# Patient Record
Sex: Male | Born: 1985 | Race: White | Hispanic: No | Smoking: Former smoker
Health system: Southern US, Community
[De-identification: ages and names within clinical notes are randomized; demographics above are authoritative.]

## PROBLEM LIST (undated history)

## (undated) DIAGNOSIS — F419 Anxiety disorder, unspecified: Secondary | ICD-10-CM

## (undated) DIAGNOSIS — M5430 Sciatica, unspecified side: Secondary | ICD-10-CM

## (undated) DIAGNOSIS — F99 Mental disorder, not otherwise specified: Secondary | ICD-10-CM

## (undated) DIAGNOSIS — T7840XA Allergy, unspecified, initial encounter: Secondary | ICD-10-CM

## (undated) HISTORY — DX: Allergy, unspecified, initial encounter: T78.40XA

## (undated) HISTORY — DX: Anxiety disorder, unspecified: F41.9

---

## 2008-09-14 ENCOUNTER — Ambulatory Visit: Payer: Self-pay | Admitting: Family Medicine

## 2008-09-14 DIAGNOSIS — R519 Headache, unspecified: Secondary | ICD-10-CM | POA: Insufficient documentation

## 2008-09-14 DIAGNOSIS — R51 Headache: Secondary | ICD-10-CM

## 2008-09-14 DIAGNOSIS — K8020 Calculus of gallbladder without cholecystitis without obstruction: Secondary | ICD-10-CM | POA: Insufficient documentation

## 2008-09-14 DIAGNOSIS — F411 Generalized anxiety disorder: Secondary | ICD-10-CM | POA: Insufficient documentation

## 2008-09-14 DIAGNOSIS — J309 Allergic rhinitis, unspecified: Secondary | ICD-10-CM | POA: Insufficient documentation

## 2008-10-01 ENCOUNTER — Ambulatory Visit: Payer: Self-pay | Admitting: Family Medicine

## 2008-10-01 DIAGNOSIS — T148XXA Other injury of unspecified body region, initial encounter: Secondary | ICD-10-CM | POA: Insufficient documentation

## 2008-12-06 ENCOUNTER — Ambulatory Visit: Payer: Self-pay | Admitting: Family Medicine

## 2008-12-06 ENCOUNTER — Telehealth: Payer: Self-pay | Admitting: Family Medicine

## 2008-12-06 DIAGNOSIS — M5430 Sciatica, unspecified side: Secondary | ICD-10-CM

## 2009-04-16 ENCOUNTER — Telehealth: Payer: Self-pay | Admitting: Family Medicine

## 2009-04-18 ENCOUNTER — Ambulatory Visit: Payer: Self-pay | Admitting: Family Medicine

## 2009-08-27 ENCOUNTER — Telehealth: Payer: Self-pay | Admitting: Family Medicine

## 2009-10-07 ENCOUNTER — Telehealth: Payer: Self-pay | Admitting: Family Medicine

## 2009-10-14 ENCOUNTER — Ambulatory Visit: Payer: Self-pay | Admitting: Family Medicine

## 2009-10-31 ENCOUNTER — Telehealth: Payer: Self-pay | Admitting: Family Medicine

## 2010-01-27 ENCOUNTER — Telehealth: Payer: Self-pay | Admitting: Family Medicine

## 2010-02-18 NOTE — Progress Notes (Signed)
Summary: klonipin  Phone Note Call from Patient Call back at [facility] Phone [phone]   Summary of Call: Needs anxiety med Klonipin 2mg .  Lost bottle & all pills in it when she cleaned out cabinet.  Has tried, but can't do without it.  Will be early refill.  Also made appt tose  Dr. Janeice Robinson.  [facility].   Initial call taken by: Rudy Jew, RN,  October 07, 2009 11:44 AM  Follow-up for Phone Call        klonopin 2 mg, dispense 60, directions one p.o. b.i.d., refills x 1 Follow-up by: Roderick Pee MD,  October 07, 2009 2:28 PM  Additional Follow-up for Phone Call Additional follow up Details #1::        Phone Call Completed Additional Follow-up by: Rudy Jew, RN,  October 07, 2009 3:11 PM    Prescriptions: KLONOPIN 2 MG TABS (CLONAZEPAM) Take 1 tablet by mouth two times a day  #60 x 1   Entered by:   Rudy Jew, RN   Authorized by:   Roderick Pee MD   Signed by:   Rudy Jew, RN on 10/07/2009   Method used:   Telephoned to ...       [facility] (retail)       [address]       [place], [place]  [address]       Ph: [phone]       Fax: [phone]   RxID:   [id number]

## 2010-02-18 NOTE — Progress Notes (Signed)
Summary: Rx Refill  Phone Note Refill Request Call back at Home Phone [phone]   Refills Requested: Medication #1:  KLONOPIN 2 MG TABS Take 1 tablet by mouth two times a day.  Method Requested: Electronic  Follow-up for Phone Call        this medication was refilled in September with refills??????????? please call patient to find out what the issue is Follow-up by: Roderick Pee MD,  October 31, 2009 1:44 PM  Additional Follow-up for Phone Call Additional follow up Details #1::        [facility] Additional Follow-up by: Lynann Beaver CMA,  October 31, 2009 3:07 PM

## 2010-02-18 NOTE — Progress Notes (Signed)
Summary: Klonopin  Phone Note Call from Patient   Caller: Patient Call For: Roderick Pee MD Summary of Call: Pt's wife threw away the bottle of his Klonopin by mistake.  Needs refills called to [facility]) [phone] Initial call taken by: Lynann Beaver CMA,  August 27, 2009 10:08 AM  Follow-up for Phone Call        Klonopin 2 mg, dispense 120 tabs directions one p.o. b.i.d......................... please check I thought he had refills at the pharmacy?????????? Follow-up by: Roderick Pee MD,  August 27, 2009 11:01 AM  Additional Follow-up for Phone Call Additional follow up Details #1::        08-12-09 patient picked up a refill from the pharmacy.  is this okay to fill? Additional Follow-up by: Kern Reap CMA Duncan Dull),  August 27, 2009 1:07 PM    Additional Follow-up for Phone Call Additional follow up Details #2::    please call the patient and find out what happened Follow-up by: Roderick Pee MD,  August 27, 2009 1:48 PM  Additional Follow-up for Phone Call Additional follow up Details #3:: Details for Additional Follow-up Action Taken: tried to call line busy. rx called in Additional Follow-up by: Kern Reap CMA Duncan Dull),  August 27, 2009 4:26 PM

## 2010-02-18 NOTE — Assessment & Plan Note (Signed)
Summary: REFILL KLONIPIN/ROA/PS pt rsc/njr   Vital Signs:  Patient profile:   25 year old male Height:      67 inches Weight:      157 pounds BMI:     24.68 Temp:     97.6 degrees F oral BP sitting:   120 / 70  (left arm) Cuff size:   regular  Vitals Entered By: Kern Reap CMA Duncan Dull) (October 14, 2009 1:53 PM) CC: follow-up visit   CC:  follow-up visit.  History of Present Illness: Zachary Madden is a 25 year old, married male, nonsmoker, who comes in today for evaluation of two problems.  He is a history of anxiety and takes Klonopin 2 mg b.i.d. and is functioning fairly well.  He will occasionally needed, take 2 mg 3 times a day.  I explained to him if the Klonopin does not seem to be working or he would like a second opinion.  I would recommend Dr. Rolly Pancake, [place].  He also has an infected tooth, however, his dental coverage does not kick in until October.  The second will give him 30 tablets of Vicodin to take until he can see his dentist  Allergies (verified): No Known Drug Allergies  Past History:  Past medical, surgical, family and social histories (including risk factors) reviewed for relevance to current acute and chronic problems.  Past Medical History: Reviewed history from 09/14/2008 and no changes required. Allergic rhinitis Anxiety Cholelithiasis Headache lower back pain  Past Surgical History: Reviewed history from 09/14/2008 and no changes required. Denies surgical history  Family History: Reviewed history from 09/14/2008 and no changes required. Father: heart disease, DM Mother: anxiety, chronic fatigue Siblings: none  Social History: Reviewed history from 09/14/2008 and no changes required. Occupation: beer delivery Married Former Smoker Alcohol use-yes Drug use-no  Review of Systems      See HPI  Physical Exam  General:  Well-developed,well-nourished,in no acute distress; alert,appropriate and cooperative throughout examination Psych:   Cognition and judgment appear intact. Alert and cooperative with normal attention span and concentration. No apparent delusions, illusions, hallucinations   Impression & Recommendations:  Problem # 1:  ANXIETY (ICD-300.00) Assessment Improved  His updated medication list for this problem includes:    Klonopin 2 Mg Tabs (Clonazepam) .Marland Kitchen... Take 1 tablet by mouth two times a day  Complete Medication List: 1)  Allegra 180 Mg Tabs (Fexofenadine hcl) .... As needed for allergies 2)  Prednisone 20 Mg Tabs (Prednisone) .... Uad 3)  Vicodin Es 7.5-750 Mg Tabs (Hydrocodone-acetaminophen) .Marland Kitchen.. 1 tab @ bedtime 4)  Klonopin 2 Mg Tabs (Clonazepam) .... Take 1 tablet by mouth two times a day  Patient Instructions: 1)  take 2 mg of Klonopin twice daily, and if you feel that this is not helping or you would like a second opinion.  I would recommend Dr. Rolly Pancake, [place]. 2)  We will also write to 30 Vicodin tablets to take as needed for pain until u  can see a dentist Prescriptions: VICODIN ES 7.5-750 MG TABS (HYDROCODONE-ACETAMINOPHEN) 1 tab @ bedtime  #30 x 0   Entered and Authorized by:   Roderick Pee MD   Signed by:   Roderick Pee MD on 10/14/2009   Method used:   Print then Give to Patient   RxID:   [id number] KLONOPIN 2 MG TABS (CLONAZEPAM) Take 1 tablet by mouth two times a day  #120 x 6   Entered and Authorized by:   Roderick Pee MD   Signed by:  Roderick Pee MD on 10/14/2009   Method used:   Print then Give to Patient   RxID:   [id number]

## 2010-02-18 NOTE — Assessment & Plan Note (Signed)
Summary: MED CK / REFILL // RS   Vital Signs:  Patient profile:   25 year old male Weight:      155 pounds Temp:     97.9 degrees F oral BP sitting:   110 / 76  (left arm) Cuff size:   regular  Vitals Entered By: Kern Reap CMA Duncan Dull) (April 18, 2009 1:37 PM) CC: follow-up visit   CC:  follow-up visit.  History of Present Illness: Zachary Madden is a 25 year old, married man, whose wife is [redacted] weeks pregnant with their first child, who comes in today for evaluation of 3 problems.  He has a history of underlying allergic rhinitis.  At this time years, especially bad.  He takes Allegra 180 mg daily and p.r.n. prednisone.  He did take a burst and taper of prednisone once or twice per year.  He also has a history of anxiety disorder.  He takes Klonopin 1.5 mg b.i.d. with good relief of his anxiety.  Will bump that up a hair to 2 mg b.i.d.  We referred him to Dr. Danielle Dess and group for evaluation of back pain.  Workup showed ruptured disk.  Is currently widowed.  Totally asymptomatic.  He does have weeks wear bothers him a lot and he has to use and pain pills.  They tried the epidural steroid injections, but they didn't help.  He was advised to exercise, avoid heavy lifting, see the neurosurgeons.  Back p.r.n.  Allergies: No Known Drug Allergies  Past History:  Past medical, surgical, family and social histories (including risk factors) reviewed for relevance to current acute and chronic problems.  Past Medical History: Reviewed history from 09/14/2008 and no changes required. Allergic rhinitis Anxiety Cholelithiasis Headache lower back pain  Past Surgical History: Reviewed history from 09/14/2008 and no changes required. Denies surgical history  Family History: Reviewed history from 09/14/2008 and no changes required. Father: heart disease, DM Mother: anxiety, chronic fatigue Siblings: none  Social History: Reviewed history from 09/14/2008 and no changes required. Occupation:  beer delivery Married Former Smoker Alcohol use-yes Drug use-no  Review of Systems      See HPI  Physical Exam  General:  Well-developed,well-nourished,in no acute distress; alert,appropriate and cooperative throughout examination Psych:  Cognition and judgment appear intact. Alert and cooperative with normal attention span and concentration. 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 1 tab @ bedtime His updated medication list for this problem includes:    Vicodin Es 7.5-750 Mg Tabs (Hydrocodone-acetaminophen) .Marland Kitchen... 1 tab @ bedtime  Problem # 2:  ANXIETY (ICD-300.00) Assessment: Improved  The following medications were removed from the medication list:    Klonopin 0.5 Mg Tabs (Clonazepam) .Marland Kitchen... Take 1 tablet by mouth two times a day    Klonopin 1 Mg Tabs (Clonazepam) .Marland Kitchen... Take 1 tablet by mouth two times a day His updated medication list for this problem includes:    Klonopin 2 Mg Tabs (Clonazepam) .Marland Kitchen... Take 1 tablet by mouth two times a day  Problem # 3:  ALLERGIC RHINITIS (ICD-477.9) Assessment: Deteriorated  His updated medication list for this problem includes:    Allegra 180 Mg Tabs (Fexofenadine hcl) .Marland Kitchen... As needed for allergies  Complete Medication List: 1)  Allegra 180 Mg Tabs (Fexofenadine hcl) .... As needed for allergies 2)  Prednisone 20 Mg Tabs (  Prednisone) .... Uad 3)  Vicodin Es 7.5-750 Mg Tabs (Hydrocodone-acetaminophen) .Marland Kitchen.. 1 tab @ bedtime 4)  Klonopin 2 Mg Tabs (Clonazepam) .... Take 1 tablet by mouth two times a day  Patient Instructions: 1)  continue the Allegra, or plain Claritin in the morning or plain Zyrtec at bedtime for her allergic rhinitis. 2)  Take a burst and taper of prednisone as outlined, when you're miserable 3)   Take Vicodin p.r.n. for severe back pain. 4)  Return yearly sooner if any problems Prescriptions: VICODIN ES 7.5-750 MG TABS (HYDROCODONE-ACETAMINOPHEN) 1 tab @ bedtime  #50 x 4   Entered and Authorized by:   Roderick Pee MD   Signed by:   Roderick Pee MD on 04/18/2009   Method used:   Print then Give to Patient   RxID:   [id number] PREDNISONE 20 MG TABS (PREDNISONE) UAD  #30 x 1   Entered and Authorized by:   Roderick Pee MD   Signed by:   Roderick Pee MD on 04/18/2009   Method used:   Print then Give to Patient   RxID:   [id number] ALLEGRA 180 MG TABS (FEXOFENADINE HCL) as needed for allergies  #100 x 3   Entered and Authorized by:   Roderick Pee MD   Signed by:   Roderick Pee MD on 04/18/2009   Method used:   Print then Give to Patient   RxID:   [id number] KLONOPIN 2 MG TABS (CLONAZEPAM) Take 1 tablet by mouth two times a day  #120 x 6   Entered and Authorized by:   Roderick Pee MD   Signed by:   Roderick Pee MD on 04/18/2009   Method used:   Print then Give to Patient   RxID:   [id number]

## 2010-02-18 NOTE — Progress Notes (Signed)
Summary: REQ FOR REFILL  Phone Note Call from Patient   Caller: Patient  [phone] Reason for Call: Refill Medication Summary of Call: Pt called to adv that he needs a refill on med: Klonopin 1 Mg ...... Pt adv [facility] [place].... Pt also scheduled appt w/ Dr Tawanna Cooler for this Thursday, 04/18/2009 at 1:45pm to discuss meds.  Initial call taken by: Debbra Riding,  April 16, 2009 11:48 AM  Follow-up for Phone Call        Clonopin 1 mg dispense 60 tabs directions one p.o. b.i.d. no refills Follow-up by: Roderick Pee MD,  April 16, 2009 1:02 PM    Prescriptions: KLONOPIN 0.5 MG TABS (CLONAZEPAM) Take 1 tablet by mouth two times a day  #60 x 0   Entered by:   Kern Reap CMA (AAMA)   Authorized by:   Roderick Pee MD   Signed by:   Kern Reap CMA (AAMA) on 04/16/2009   Method used:   Telephoned to ...       [facility] [place] ([facility]       [address]       [place], [place]  [address]       Ph: [phone]       Fax: [phone]   RxID:   [id number]

## 2010-02-20 NOTE — Progress Notes (Signed)
Summary: back pain  Phone Note Call from Patient Call back at Home Phone [phone]   Caller: Patient Call For: Roderick Pee MD Summary of Call: Pt states he is havng severe back pain, and needs to see Dr. Tawanna Cooler today?  Schedule him where?  He refused tomorrow. Initial call taken by: Lynann Beaver CMA AAMA,  January 27, 2010 10:28 AM  Follow-up for Phone Call        work in today Follow-up by: Roderick Pee MD,  January 27, 2010 12:40 PM  Additional Follow-up for Phone Call Additional follow up Details #1::        spoke with patient and he is feeling some better after taking some medication.   Additional Follow-up by: Kern Reap CMA Duncan Dull),  January 27, 2010 3:36 PM

## 2010-04-01 ENCOUNTER — Ambulatory Visit (INDEPENDENT_AMBULATORY_CARE_PROVIDER_SITE_OTHER): Payer: PRIVATE HEALTH INSURANCE | Admitting: Family Medicine

## 2010-04-01 ENCOUNTER — Encounter: Payer: Self-pay | Admitting: Family Medicine

## 2010-04-01 DIAGNOSIS — F411 Generalized anxiety disorder: Secondary | ICD-10-CM

## 2010-04-01 DIAGNOSIS — J309 Allergic rhinitis, unspecified: Secondary | ICD-10-CM

## 2010-04-01 MED ORDER — CLONAZEPAM 1 MG PO TABS: ORAL_TABLET | ORAL | Status: DC

## 2010-04-01 MED ORDER — CLONAZEPAM 2 MG PO TABS: ORAL_TABLET | ORAL | Status: DC

## 2010-04-01 MED ORDER — PREDNISONE 20 MG PO TABS: ORAL_TABLET | ORAL | Status: DC

## 2010-04-01 NOTE — Patient Instructions (Signed)
Let's try to slowly taper the Klonopin by taking 2 mg in the morning 1 mg at bedtime for two months.............. then 1 mg twice daily   Prednisone one tab x 3 days a half x 3 days, then half a tablet Monday, Wednesday, Friday, for a two to 3-week taper.  Allegra 180 mg daily.  Return one year or sooner if any problems

## 2010-04-01 NOTE — Progress Notes (Signed)
  Subjective:    Patient ID: Zachary Madden, male    DOB: Sep 23, 1985, 25 y.o.   MRN: [id number]  HPIJeremy is a delightful 25 year old male, who comes in today for evaluation of anxiety and allergic rhinitis.  He is a history of generalized anxiety for which he takes Klonopin 2 mg b.i.d.  He would like to decrease the dose and see how he does.  He also takes Allegra 180 mg daily and when his allergies get very bad usually in the spring.  He takes a short course of prednisone.      Review of Systems Negative    Objective:   Physical Exam    Well-developed well-nourished, male in no acute distress.  HEENT negative.  Neck supple.  No adenopathy.  Lungs clear    Assessment & Plan:  Allergic rhinitis..........Marland Kitchen Allegra daily, and his son burst and taper p.r.n.  General anxiety disorder.  Continue Klonopin to begin to try to taper dose

## 2010-10-03 ENCOUNTER — Telehealth: Payer: Self-pay | Admitting: Family Medicine

## 2010-10-03 DIAGNOSIS — F411 Generalized anxiety disorder: Secondary | ICD-10-CM

## 2010-10-03 MED ORDER — CLONAZEPAM 2 MG PO TABS: ORAL_TABLET | ORAL | Status: DC

## 2010-10-03 NOTE — Telephone Encounter (Signed)
Refill klonopin 1 mg and 2mg  [facility].

## 2011-04-14 ENCOUNTER — Telehealth: Payer: Self-pay | Admitting: Family Medicine

## 2011-04-14 DIAGNOSIS — F411 Generalized anxiety disorder: Secondary | ICD-10-CM

## 2011-04-14 NOTE — Telephone Encounter (Signed)
Pt  Requesting refill on clonazePAM (KLONOPIN) 2 MG tablet  [facility] [place] [address]

## 2011-04-15 MED ORDER — CLONAZEPAM 2 MG PO TABS: ORAL_TABLET | ORAL | Status: DC

## 2011-05-25 ENCOUNTER — Telehealth: Payer: Self-pay | Admitting: Family Medicine

## 2011-05-25 NOTE — Telephone Encounter (Signed)
Last office visit was 04/01/10.  Is this okay to fill?

## 2011-05-25 NOTE — Telephone Encounter (Signed)
Patient will need an office visit for more refills. 

## 2011-05-25 NOTE — Telephone Encounter (Signed)
Appointment made

## 2011-05-25 NOTE — Telephone Encounter (Signed)
Patient called stating that he need a refill of his 1mg  klonipin. Please assist.

## 2011-05-28 ENCOUNTER — Ambulatory Visit (INDEPENDENT_AMBULATORY_CARE_PROVIDER_SITE_OTHER): Payer: Self-pay | Admitting: Family Medicine

## 2011-05-28 ENCOUNTER — Encounter: Payer: Self-pay | Admitting: Family Medicine

## 2011-05-28 VITALS — BP 120/80 | Temp 98.1°F | Wt 147.0 lb

## 2011-05-28 DIAGNOSIS — F411 Generalized anxiety disorder: Secondary | ICD-10-CM

## 2011-05-28 MED ORDER — CLONAZEPAM 2 MG PO TABS: ORAL_TABLET | ORAL | Status: DC

## 2011-05-28 NOTE — Progress Notes (Signed)
  Subjective:    Patient ID: Zachary Madden, male    DOB: 03-18-1985, 26 y.o.   MRN: [id number]  HPI Zachary Madden is a 26 year old male who comes in to discuss GAD  His general anxiety disorder is a combination of genetics,,,,,,,,,, his mom,,,,,,, in situations in his life. He was without insurance for a while and now has gotten insurance and was to restart his Klonopin. We had him on 2 mg in the morning 1 mg at noon and 2 mg at bedtime he was able to function well he would like to restart that regime. No history of drug abuse   Review of Systems    general review of systems negative no alcohol use Objective:   Physical Exam Well-developed well-nourished male in no acute distress       Assessment & Plan:  General anxiety disorder restart medication

## 2011-05-28 NOTE — Patient Instructions (Signed)
Restart the Klonopin,,,,,,,,,,, 2 mg in the morning, 1 mg at noon, 2 mg in the evening  Return in 6 months sooner if any problems,

## 2011-05-29 ENCOUNTER — Telehealth: Payer: Self-pay | Admitting: Family Medicine

## 2011-05-29 MED ORDER — TRAMADOL HCL 50 MG PO TABS: ORAL_TABLET | ORAL | Status: DC

## 2011-05-29 NOTE — Telephone Encounter (Signed)
Pt has question about medication and said it is URGENT. Please contact

## 2011-05-29 NOTE — Telephone Encounter (Signed)
rx sent. Left message on machine for patient   

## 2011-05-29 NOTE — Telephone Encounter (Signed)
Patient is calling for refill of Vicodin is this okay fill?

## 2011-06-11 ENCOUNTER — Ambulatory Visit: Payer: PRIVATE HEALTH INSURANCE | Admitting: Family Medicine

## 2011-09-04 ENCOUNTER — Telehealth: Payer: Self-pay | Admitting: Family Medicine

## 2011-09-04 MED ORDER — TRAMADOL HCL 50 MG PO TABS: ORAL_TABLET | ORAL | Status: DC

## 2011-09-04 NOTE — Telephone Encounter (Signed)
Tramadol refill sent to pharmacy.  Patient prefers Vicodin.

## 2011-09-04 NOTE — Telephone Encounter (Signed)
Pt requesting refill on    HYDROcodone-acetaminophen (VICODIN ES) 7.5-750 MG per tablet    Pt states he spoke with Dr. Tawanna Cooler about his reoccurring back pain and was informed to contact the office if in need of pain medication. Pt also requesting to be contacted pt states he is in a lot of pain

## 2011-09-15 NOTE — Telephone Encounter (Signed)
If the tramadol is not helping control his pain then we would recommend an office visit next week for further evaluation

## 2011-09-15 NOTE — Telephone Encounter (Signed)
Patient is aware and an appointment made 

## 2011-09-24 ENCOUNTER — Ambulatory Visit: Payer: Self-pay | Admitting: Family Medicine

## 2011-10-27 ENCOUNTER — Telehealth: Payer: Self-pay | Admitting: Family Medicine

## 2011-10-27 NOTE — Telephone Encounter (Signed)
Caller: [name]/Patient; Patient Name: Zachary Madden; PCP: Kelle Darting [facility]); Best Callback Phone Number: [phone]; Reason for call: severe pain to left foot. Patient reports that he dropped an engine block on the foot. Onset 10/27/11. Reports bruising to the area and severe pain. Patient reports he does not want to go to the hospital because he does not have insurance right now. Patient also needs a refill on his Klonopin at this time. Emergent sx of "New change in sensation, change in skin color, feels cool to the touch, severe pain or no pulse below level of injury" positive per Foot Injury guideline. Disposition: See ED Immediately. Patient refuses to go to ED at this time. Offered to schedule earliest appointment, but patient is unable to come in to the office today. Requested appointment on 10/28/11. Appointment scheduled with Dr. Tawanna Cooler on 10/28/11 at 9:15am. Patient very concerned about his Klonopin refill also. Patient wants to use [facility] on [address]. Phone number [phone]. Patient does not have anymore refills on the Klonopin at this time. PLEASE CALL PATIENT BACK ABOUT THE KLONOPIN REFILL. HE WANTS TO USE A NEW PHARMACY WHICH IS CLOSER TO HIS HOME.

## 2011-10-27 NOTE — Telephone Encounter (Signed)
Dr Tawanna Cooler please advise

## 2011-10-28 ENCOUNTER — Ambulatory Visit: Payer: Self-pay | Admitting: Family Medicine

## 2011-10-28 NOTE — Telephone Encounter (Signed)
Pt had scheduled appointment for today, Wednesday at 10, but must have cancelled appointment

## 2011-10-29 ENCOUNTER — Ambulatory Visit: Payer: Self-pay | Admitting: Family Medicine

## 2011-11-02 ENCOUNTER — Ambulatory Visit (INDEPENDENT_AMBULATORY_CARE_PROVIDER_SITE_OTHER): Payer: Self-pay | Admitting: Family Medicine

## 2011-11-02 ENCOUNTER — Encounter: Payer: Self-pay | Admitting: Family Medicine

## 2011-11-02 ENCOUNTER — Telehealth: Payer: Self-pay | Admitting: Family Medicine

## 2011-11-02 VITALS — BP 130/80 | Temp 98.0°F | Wt 161.0 lb

## 2011-11-02 DIAGNOSIS — M549 Dorsalgia, unspecified: Secondary | ICD-10-CM

## 2011-11-02 DIAGNOSIS — M5430 Sciatica, unspecified side: Secondary | ICD-10-CM

## 2011-11-02 DIAGNOSIS — F411 Generalized anxiety disorder: Secondary | ICD-10-CM

## 2011-11-02 MED ORDER — TRAMADOL HCL 50 MG PO TABS: ORAL_TABLET | ORAL | Status: DC

## 2011-11-02 MED ORDER — CLONAZEPAM 1 MG PO TABS: ORAL_TABLET | ORAL | Status: DC

## 2011-11-02 NOTE — Telephone Encounter (Signed)
°  Caller: Ulas/Patient; Patient Name: Zachary Madden; PCP: Kelle Darting [facility]); [facility] Call[facility] Phone Number: [phone]; Reason for call:   Pain in left foot that started 10/30/11 and chest pain started 10/31/11. Chest pain is what is concerning him now.   Feels like soreness in a rib to the left center of his  chest.   No HX of respiratory problems.  Triaged Chest Pain  and all emergent symptoms ruled out.  Pain  changes with a deep breath. He was told to come to the office "about 1330" and he would like to come sooner.  Disposition is needs to be seen in 24 hours.  Hhe is anxious about this and has transportation for the next "couple of hours".  Home care and call back instructions given. Pt's cell # is [phone].   PLEASE CALL PATIENT IF HE CAN BE SEEN SOONER.

## 2011-11-02 NOTE — Telephone Encounter (Signed)
Dr Tawanna Cooler unable to see patient earlier due to a prior engagement.

## 2011-11-02 NOTE — Patient Instructions (Signed)
Klonopin one tablet 3 times daily  Tramadol 1/2-1 tablet 3 times daily as needed for back pain  Call you neurosurgeon to get a followup evaluation

## 2011-11-02 NOTE — Progress Notes (Signed)
  Subjective:    Patient ID: Zachary Madden, male    DOB: 03-09-1985, 26 y.o.   MRN: [id number]  HPI Zachary Madden is a 26 year old single male who comes in today for evaluation of 2 problems  He has a history of chronic anxiety and we've been treating him with Klonopin 1 mg 3 times daily. He states that he is doing well and functioning normally. I explained to him that if this is not working or if he feels like he needs an increase in the medication then he'll need to go see a psychiatrist  I will write him 6 months of medication at the time  He's been to see a neurosurgeon for back pain. He now once me to write him a prescription for pain pills. I explained to him that he'll need to go back and see the neurosurgeon who did his initial evaluation in the meantime I would write him a short course of tramadol   Review of Systems    general review of systems otherwise negative Objective:   Physical Exam  Well-developed well-nourished male in no acute distress      Assessment & Plan:  Chronic anxiety continue Klonopin 1 mg 3 times daily  Back pain Motrin 600 twice a day, tramadol 50 mg 1/2-1 tablet at bedtime when necessary  Reevaluation by specialist ASAP

## 2011-11-26 ENCOUNTER — Ambulatory Visit: Payer: Self-pay | Admitting: Family Medicine

## 2011-12-14 ENCOUNTER — Other Ambulatory Visit: Payer: Self-pay | Admitting: Family Medicine

## 2011-12-14 DIAGNOSIS — F411 Generalized anxiety disorder: Secondary | ICD-10-CM

## 2011-12-14 NOTE — Telephone Encounter (Signed)
Pt called to check status of getting clonopin changed back to 10 mg. Pt said that he is having panic attacks and wants to make sure this is done by end of day today[facility] [place] Pls call pt when done.

## 2011-12-14 NOTE — Telephone Encounter (Signed)
Pt called to follow up

## 2011-12-14 NOTE — Telephone Encounter (Signed)
Patient Information:  Caller Name: Gorden  Phone: [phone]  Patient: Zachary Madden, Zachary Madden  Gender: Male  DOB: 06-Jan-1986  Age: 26 Years  PCP: Kelle Darting [facility])   Symptoms  Reason For Call & Symptoms: patient states he is out of Klonopin.  He recently had a decrease in the dose (from 2 mg to 1 mg).  Pt states his anxiety has been causing him to double up the 1mg  tablets and now he is completely out.  Pt is requesting medication be refilled at the 2mg  dose and [facility] [place]  Reviewed Health History In EMR: Yes  Reviewed Medications In EMR: Yes  Reviewed Allergies In EMR: Yes  Date of Onset of Symptoms: 12/14/2011  Guideline(s) Used:  No Protocol Available - Information Only  Disposition Per Guideline:   Discuss with PCP and Callback by Nurse Today  Reason For Disposition Reached:   Nursing judgment  Advice Given:  Call Back If:  New symptoms develop  You become worse.  Office Follow Up:  Does the office need to follow up with this patient?: Yes  Instructions For The Office: OFFICE PLEASE FOLLOW UP WITH PT IF KLONOPIN 2 MG TABLETS CAN BE REFILLED AND [facility] TO [facility]

## 2011-12-15 MED ORDER — CLONAZEPAM 2 MG PO TABS: ORAL_TABLET | ORAL | Status: DC

## 2011-12-15 NOTE — Telephone Encounter (Signed)
One month called in with no refills.  Patient will need an office visit for more refills.  Left message on machine at pharmacy and tried to leave message with patient but voice mail has not been set up yet.

## 2011-12-29 ENCOUNTER — Encounter: Payer: Self-pay | Admitting: Family Medicine

## 2011-12-29 ENCOUNTER — Ambulatory Visit (INDEPENDENT_AMBULATORY_CARE_PROVIDER_SITE_OTHER): Payer: Self-pay | Admitting: Family Medicine

## 2011-12-29 VITALS — BP 130/80 | Temp 97.7°F | Wt 164.0 lb

## 2011-12-29 DIAGNOSIS — F411 Generalized anxiety disorder: Secondary | ICD-10-CM

## 2011-12-29 DIAGNOSIS — M5430 Sciatica, unspecified side: Secondary | ICD-10-CM

## 2011-12-29 MED ORDER — HYDROCODONE-ACETAMINOPHEN 7.5-650 MG PO TABS: ORAL_TABLET | ORAL | Status: DC

## 2011-12-29 MED ORDER — CLONAZEPAM 2 MG PO TABS: ORAL_TABLET | ORAL | Status: DC

## 2011-12-29 NOTE — Patient Instructions (Signed)
Continue the Klonopin 2 mg 3 times daily  Motrin 800 mg twice daily with food during the day  Vicodin............ one half to one tablet at bedtime when necessary for pain  We will write you a 3 months supply of medication. During that time we will get you set up at the pain clinic for further violation to help relieve your pain.

## 2011-12-29 NOTE — Progress Notes (Signed)
  Subjective:    Patient ID: Zachary Madden, male    DOB: 04/12/1985, 26 y.o.   MRN: [id number]  HPI Zachary Madden. is a 26 year old single male nonsmoker who comes in today to discuss chronic anxiety and back pain  He is a history of chronic anxiety for which he takes Klonopin 2 mg 3 times daily.  He has a history of lumbar discs his face. We sent him for a neurosurgical evaluation 6 months ago. At that time they thought he had lumbar disc disease but was not a surgical candidate. He was treated with epidural steroid injections which didn't seem to help. He was told to do an exercise program which he has continued. He would also try to months and tramadol every said that didn't help. He wants a refill for hydrocodone. I explained to him and he was given the pain medication on a long-term basis he would need to go and be evaluated the pain clinic.  Currently working for [employer] full time   Review of Systems Gen. review of systems otherwise negative    Objective:   Physical Exam Well-developed well-nourished male in no acute distress  No exam done today discussed medication treatment options etc.       Assessment & Plan:  Chronic anxiety continue Klonopin 2 mg 3 times daily  Lumbar back pain will temporarily refill his Vicodin one half to one tablet at bedtime only. Then he will be referred to the pain clinic for further treatment options

## 2012-01-04 ENCOUNTER — Ambulatory Visit: Payer: Self-pay | Admitting: Family Medicine

## 2012-01-05 ENCOUNTER — Ambulatory Visit: Payer: Self-pay | Admitting: Family Medicine

## 2012-03-24 ENCOUNTER — Other Ambulatory Visit: Payer: Self-pay | Admitting: Family Medicine

## 2012-05-02 ENCOUNTER — Other Ambulatory Visit: Payer: Self-pay | Admitting: Family Medicine

## 2012-05-05 ENCOUNTER — Ambulatory Visit (INDEPENDENT_AMBULATORY_CARE_PROVIDER_SITE_OTHER): Payer: Self-pay | Admitting: Family Medicine

## 2012-05-05 ENCOUNTER — Encounter: Payer: Self-pay | Admitting: Family Medicine

## 2012-05-05 VITALS — BP 120/80 | Temp 97.5°F | Wt 151.0 lb

## 2012-05-05 DIAGNOSIS — F411 Generalized anxiety disorder: Secondary | ICD-10-CM

## 2012-05-05 DIAGNOSIS — J309 Allergic rhinitis, unspecified: Secondary | ICD-10-CM

## 2012-05-05 DIAGNOSIS — M5430 Sciatica, unspecified side: Secondary | ICD-10-CM

## 2012-05-05 MED ORDER — HYDROCODONE-ACETAMINOPHEN 7.5-650 MG PO TABS: ORAL_TABLET | ORAL | Status: DC

## 2012-05-05 MED ORDER — PREDNISONE 20 MG PO TABS: ORAL_TABLET | ORAL | Status: DC

## 2012-05-05 MED ORDER — CLONAZEPAM 2 MG PO TABS: ORAL_TABLET | ORAL | Status: DC

## 2012-05-05 NOTE — Progress Notes (Signed)
  Subjective:    Patient ID: Zachary Madden, male    DOB: [birth year], 27 y.o.   MRN: [id number]  HPI Zachary Madden is a 27 year old single male nonsmoker who comes in today for yearly evaluation of chronic anxiety disorder, allergic rhinitis, and chronic back pain  He's had a 10 year history of chronic anxiety disorder. It runs in his family. He's been on Klonopin 2 mg 3 times a day and has done well no complications her medication  He takes over-the-counter Claritin for allergic rhinitis however this spring he's having a lot of symptoms and would like to take a round of prednisone.  He has a history of ruptured disc. He seen and evaluated by neurosurgery and chronic pain center. He's had injections. He now takes one Vicodin daily at bedtime. 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

## 2012-05-05 NOTE — Addendum Note (Signed)
Addended by: Kern Reap B on: 05/05/2012 11:27 AM   Modules accepted: Orders

## 2012-05-05 NOTE — Patient Instructions (Signed)
Prednisone burst and taper  Klonopin 2 mg........ 3 times daily  . Vicodin............Marland Kitchen 1 tab daily at bedtime  Return in one year sooner if any problems

## 2012-08-29 ENCOUNTER — Telehealth: Payer: Self-pay | Admitting: Family Medicine

## 2012-08-29 NOTE — Telephone Encounter (Signed)
Pt again asking for early refill on KLONOPIN. Last month, he told pharm that his dad took some of them. This time he states that he lost them. Please advise.

## 2012-08-29 NOTE — Telephone Encounter (Signed)
Early refill denied.  May not be refilled until 09/12/12. Spoke with Melissa at [facility]

## 2012-09-05 ENCOUNTER — Ambulatory Visit: Payer: Self-pay | Admitting: Internal Medicine

## 2012-09-05 DIAGNOSIS — Z0289 Encounter for other administrative examinations: Secondary | ICD-10-CM

## 2012-11-04 ENCOUNTER — Telehealth: Payer: Self-pay | Admitting: Family Medicine

## 2012-11-04 DIAGNOSIS — Z0283 Encounter for blood-alcohol and blood-drug test: Secondary | ICD-10-CM

## 2012-11-04 NOTE — Telephone Encounter (Signed)
Pt needs refill of HYDROcodone-acetaminophen (NORCO) 7.5-325 MG per tablet

## 2012-11-08 NOTE — Telephone Encounter (Signed)
Left detailed message on machine for patient and lab test ordered

## 2012-11-08 NOTE — Telephone Encounter (Signed)
Fleet Contras please call [facility],,,,,,,,, we need to have a list of his current medications and be as a new law he needs to come in for urine drug test. Once that is complete then we will discuss ongoing followup and prescriptions and lesion on

## 2012-11-09 NOTE — Telephone Encounter (Signed)
Pt did not get message. pls call on a new number;   [phone]

## 2012-11-10 NOTE — Telephone Encounter (Signed)
Spoke with patient.

## 2012-11-14 ENCOUNTER — Other Ambulatory Visit: Payer: Self-pay | Admitting: Family Medicine

## 2012-11-15 ENCOUNTER — Ambulatory Visit: Payer: Self-pay | Admitting: Family Medicine

## 2012-11-15 ENCOUNTER — Telehealth: Payer: Self-pay | Admitting: Family Medicine

## 2012-11-15 NOTE — Telephone Encounter (Signed)
Pt would like a call concerning a medication.

## 2012-11-16 NOTE — Telephone Encounter (Signed)
Left message on machine for patient returning his call.  Patient will need an office visit for any refills.

## 2012-11-20 ENCOUNTER — Other Ambulatory Visit: Payer: Self-pay | Admitting: Family Medicine

## 2012-12-30 ENCOUNTER — Telehealth: Payer: Self-pay | Admitting: Family Medicine

## 2012-12-30 NOTE — Telephone Encounter (Signed)
Pt request refill his clonazePAM (KLONOPIN) 2 MG tablet New Pharm: [facility] [facility] [place] Pt is out of meds.

## 2012-12-30 NOTE — Telephone Encounter (Signed)
Spoke with patient.  He will need to leave a urine sample before any Rx given.

## 2013-01-02 ENCOUNTER — Telehealth: Payer: Self-pay | Admitting: Family Medicine

## 2013-01-02 ENCOUNTER — Encounter: Payer: Self-pay | Admitting: *Deleted

## 2013-01-02 NOTE — Telephone Encounter (Signed)
Pt advised he needs to wait for lab results to come back and RN will call to let him know when RX is ready.  Pt verbalized "ok".

## 2013-01-04 NOTE — Telephone Encounter (Signed)
Left message on machine for patient that lab results are not back yet.

## 2013-01-04 NOTE — Telephone Encounter (Signed)
Pt is call requesting ua results

## 2013-01-06 ENCOUNTER — Telehealth: Payer: Self-pay | Admitting: Family Medicine

## 2013-01-06 NOTE — Telephone Encounter (Signed)
Left message on machine for patient that results are back waiting for Dr Tawanna Cooler

## 2013-01-06 NOTE — Telephone Encounter (Addendum)
Would like results of urine done mon. pls call. Pt would like the rx refill as soon as he can get

## 2013-01-09 ENCOUNTER — Telehealth: Payer: Self-pay | Admitting: Family Medicine

## 2013-01-09 NOTE — Telephone Encounter (Signed)
Per Dr Tawanna Cooler no more prescriptions for klonopin.  Patient will have to go to behavioral health.  Patient is aware.

## 2013-01-09 NOTE — Telephone Encounter (Signed)
Spoke with patient and Dr Tawanna Cooler can no longer write prescriptions for Klonopin

## 2013-01-09 NOTE — Telephone Encounter (Signed)
Pt says he has done his ua, wants to know when he will be able to get some medication for relief. Please advise.

## 2013-01-23 ENCOUNTER — Encounter: Payer: Self-pay | Admitting: Family Medicine

## 2013-02-17 ENCOUNTER — Encounter (HOSPITAL_COMMUNITY): Payer: Self-pay | Admitting: Emergency Medicine

## 2013-02-17 ENCOUNTER — Emergency Department (HOSPITAL_COMMUNITY)
Admission: EM | Admit: 2013-02-17 | Discharge: 2013-02-18 | Disposition: A | Discharge status: Other Acute Inpatient Hospital | Payer: No Typology Code available for payment source | Attending: Emergency Medicine | Admitting: Emergency Medicine

## 2013-02-17 DIAGNOSIS — F411 Generalized anxiety disorder: Secondary | ICD-10-CM | POA: Insufficient documentation

## 2013-02-17 DIAGNOSIS — F1320 Sedative, hypnotic or anxiolytic dependence, uncomplicated: Secondary | ICD-10-CM | POA: Insufficient documentation

## 2013-02-17 DIAGNOSIS — Z8719 Personal history of other diseases of the digestive system: Secondary | ICD-10-CM | POA: Insufficient documentation

## 2013-02-17 DIAGNOSIS — R45850 Homicidal ideations: Secondary | ICD-10-CM | POA: Insufficient documentation

## 2013-02-17 DIAGNOSIS — Z79899 Other long term (current) drug therapy: Secondary | ICD-10-CM | POA: Insufficient documentation

## 2013-02-17 DIAGNOSIS — R45851 Suicidal ideations: Secondary | ICD-10-CM | POA: Insufficient documentation

## 2013-02-17 DIAGNOSIS — F329 Major depressive disorder, single episode, unspecified: Secondary | ICD-10-CM | POA: Insufficient documentation

## 2013-02-17 LAB — CBC WITH DIFFERENTIAL/PLATELET
Basophils Absolute: 0 10*3/uL (ref 0.0–0.1)
Basophils Relative: 0 % (ref 0–1)
EOS PCT: 1 % (ref 0–5)
Eosinophils Absolute: 0.1 10*3/uL (ref 0.0–0.7)
HEMATOCRIT: 41.7 % (ref 39.0–52.0)
Hemoglobin: 15 g/dL (ref 13.0–17.0)
Lymphocytes Relative: 23 % (ref 12–46)
Lymphs Abs: 2.5 10*3/uL (ref 0.7–4.0)
MCH: 32 pg (ref 26.0–34.0)
MCHC: 36 g/dL (ref 30.0–36.0)
MCV: 88.9 fL (ref 78.0–100.0)
MONO ABS: 1 10*3/uL (ref 0.1–1.0)
Monocytes Relative: 9 % (ref 3–12)
Neutro Abs: 7.1 10*3/uL (ref 1.7–7.7)
Neutrophils Relative %: 67 % (ref 43–77)
PLATELETS: 337 10*3/uL (ref 150–400)
RBC: 4.69 MIL/uL (ref 4.22–5.81)
RDW: 12.8 % (ref 11.5–15.5)
WBC: 10.6 10*3/uL — ABNORMAL HIGH (ref 4.0–10.5)

## 2013-02-17 LAB — COMPREHENSIVE METABOLIC PANEL
ALT: 17 U/L (ref 0–53)
AST: 21 U/L (ref 0–37)
Albumin: 4.6 g/dL (ref 3.5–5.2)
Alkaline Phosphatase: 101 U/L (ref 39–117)
BUN: 14 mg/dL (ref 6–23)
CALCIUM: 9.7 mg/dL (ref 8.4–10.5)
CO2: 23 mEq/L (ref 19–32)
CREATININE: 0.91 mg/dL (ref 0.50–1.35)
Chloride: 101 mEq/L (ref 96–112)
GFR calc Af Amer: >90 mL/min (ref >90)
GFR calc non Af Amer: >90 mL/min (ref >90)
Glucose, Bld: 129 mg/dL — ABNORMAL HIGH (ref 70–99)
Potassium: 3.9 mEq/L (ref 3.7–5.3)
Sodium: 139 mEq/L (ref 137–147)
TOTAL PROTEIN: 8.4 g/dL — AB (ref 6.0–8.3)
Total Bilirubin: 0.4 mg/dL (ref 0.3–1.2)

## 2013-02-17 LAB — ETHANOL: Alcohol, Ethyl (B): <11 mg/dL (ref 0–11)

## 2013-02-17 LAB — ACETAMINOPHEN LEVEL

## 2013-02-17 LAB — SALICYLATE LEVEL

## 2013-02-17 MED ORDER — ACETAMINOPHEN 325 MG PO TABS: 650.0000 mg | ORAL_TABLET | Freq: Once | ORAL | Status: DC

## 2013-02-17 NOTE — ED Notes (Signed)
Pt resting on stretcher with eyes closed, RR even and unlabored, NAD 

## 2013-02-17 NOTE — ED Provider Notes (Signed)
Medical screening examination/treatment/procedure(s) were performed by non-physician practitioner and as supervising physician I was immediately available for consultation/collaboration.  EKG Interpretation   None         Zachary Bison Hannalee Castor, DO 02/17/13 2127

## 2013-02-17 NOTE — ED Notes (Signed)
Pt from home requesting to go through detox from rx drug and marijuana so that he can go to [facility]. Pt appears sleepy and is slurring his words. Pt in NAD. PA has assessed pt.

## 2013-02-17 NOTE — ED Provider Notes (Signed)
CSN: [id number]     Arrival date & time 02/17/13  1836 History   First MD Initiated Contact with Patient 02/17/13 1847     Chief Complaint  Patient presents with  . Drug / Alcohol Assessment   The history is provided by the patient. No language interpreter was used.   HPI Comments: Zachary Madden is a 28 y.o. male who presents to the Emergency Department requesting detox. He states that he has been abusing prescription pain medicines, denies illicit drug use. He states occasional smokes marijuana, last use 2 days ago.  States he is a social drinker, usually wine with meals but denies using EtOH in excess.  He reports went through detox 2 months ago at [facility] but states "i still have more to learn and would like to go back there".  States he does battle some anger and aggression issues-- has had some passive SI and HI during these times of anger but does not have a specific plan or feel that he would act on these thoughts.  Denies AVH.    Past Medical History  Diagnosis Date  . Allergy   . Anxiety   . Cholelithiasis    No past surgical history on file. Family History  Problem Relation Age of Onset  . Anxiety disorder Mother   . Chronic fatigue Mother   . Heart disease Father   . Diabetes Father    History  Substance Use Topics  . Smoking status: Former Research scientist (life sciences)  . Smokeless tobacco: Not on file  . Alcohol Use: No    Review of Systems  Constitutional: Negative for fever.  Respiratory: Negative for cough.   Psychiatric/Behavioral: Negative for hallucinations and self-injury.       Detox  All other systems reviewed and are negative.   A complete 10 system review of systems was obtained and all systems are negative except as noted in the HPI and PMH.   Allergies  Review of patient's allergies indicates no known allergies.  Home Medications   Current Outpatient Rx  Name  Route  Sig  Dispense  Refill  . clonazePAM (KLONOPIN) 1 MG tablet   Oral   Take 2 mg by mouth 3  (three) times daily as needed for anxiety.         . Creatine POWD   Oral   Take 1 scoop by mouth daily.         Marland Kitchen FLUoxetine (PROZAC) 20 MG tablet   Oral   Take 20 mg by mouth daily.         Marland Kitchen HYDROcodone-acetaminophen (NORCO) 10-325 MG per tablet   Oral   Take 1 tablet by mouth every 6 (six) hours as needed for moderate pain.         . Multiple Vitamin (MULTIVITAMIN WITH MINERALS) TABS tablet   Oral   Take 1 tablet by mouth daily.          Triage Vitals: BP 127/92  Pulse 87  Temp(Src) 98.1 F (36.7 C) (Oral)  Resp 16  SpO2 99%  Physical Exam  Nursing note and vitals reviewed. Constitutional: He is oriented to person, place, and time. He appears well-developed and well-nourished. No distress.  Appears drowsy  HENT:  Head: Normocephalic and atraumatic.  Mouth/Throat: Oropharynx is clear and moist.  Eyes: Conjunctivae and EOM are normal. Pupils are equal, round, and reactive to light.  Pupils dilated but reactive  Neck: Normal range of motion. Neck supple.  Cardiovascular: Normal rate, regular rhythm and  normal heart sounds.   Pulmonary/Chest: Effort normal and breath sounds normal. No respiratory distress. He has no wheezes.  Abdominal: Soft. Bowel sounds are normal. There is no tenderness. There is no guarding.  Musculoskeletal: Normal range of motion.  Neurological: He is alert and oriented to person, place, and time.  Skin: Skin is warm and dry. He is not diaphoretic.  Psychiatric: He has a normal mood and affect. His speech is slurred. He is not withdrawn. Thought content is not delusional. He expresses no suicidal plans and no homicidal plans.  Expresses passive SI and HI without intent or plan; denies AVH; speech mildly slurred   ED Course  Procedures (including critical care time) DIAGNOSTIC STUDIES: Oxygen Saturation is 99% on room air, normal by my interpretation.    COORDINATION OF CARE: At 700 PM Discussed treatment plan with patient which  includes blood work, UA/UD. Patient agrees.   Labs Review Labs Reviewed  CBC WITH DIFFERENTIAL  COMPREHENSIVE METABOLIC PANEL  ETHANOL  URINE RAPID DRUG SCREEN (HOSP PERFORMED)  SALICYLATE LEVEL  ACETAMINOPHEN LEVEL   Imaging Review No results found.  EKG Interpretation   None      MDM  No diagnosis found.  Pt requesting detox from prescription narcotic medications.  Expresses some passive SI and HI during moments of rage without intent or specific plan.  Labs pending at this time.  Care signed out to [place]-- will follow labs and medically clear when appropriate.  I personally performed the services described in this documentation, which was scribed in my presence. The recorded information has been reviewed and is accurate.  Larene Pickett, PA-C 02/17/13 2006

## 2013-02-18 ENCOUNTER — Encounter (HOSPITAL_COMMUNITY): Payer: Self-pay | Admitting: *Deleted

## 2013-02-18 ENCOUNTER — Inpatient Hospital Stay (HOSPITAL_COMMUNITY)
Admission: AD | Admit: 2013-02-18 | Discharge: 2013-02-20 | DRG: 897 | Disposition: A | Discharge status: Home / Self Care | Payer: No Typology Code available for payment source | Source: Intra-hospital | Attending: Psychiatry | Admitting: Psychiatry

## 2013-02-18 DIAGNOSIS — G4700 Insomnia, unspecified: Secondary | ICD-10-CM | POA: Diagnosis present

## 2013-02-18 DIAGNOSIS — Z8249 Family history of ischemic heart disease and other diseases of the circulatory system: Secondary | ICD-10-CM

## 2013-02-18 DIAGNOSIS — F411 Generalized anxiety disorder: Secondary | ICD-10-CM

## 2013-02-18 DIAGNOSIS — F410 Panic disorder [episodic paroxysmal anxiety] without agoraphobia: Secondary | ICD-10-CM

## 2013-02-18 DIAGNOSIS — F332 Major depressive disorder, recurrent severe without psychotic features: Secondary | ICD-10-CM | POA: Diagnosis present

## 2013-02-18 DIAGNOSIS — F1110 Opioid abuse, uncomplicated: Secondary | ICD-10-CM

## 2013-02-18 DIAGNOSIS — F1320 Sedative, hypnotic or anxiolytic dependence, uncomplicated: Secondary | ICD-10-CM

## 2013-02-18 DIAGNOSIS — Z87891 Personal history of nicotine dependence: Secondary | ICD-10-CM

## 2013-02-18 DIAGNOSIS — Z833 Family history of diabetes mellitus: Secondary | ICD-10-CM

## 2013-02-18 DIAGNOSIS — F1120 Opioid dependence, uncomplicated: Secondary | ICD-10-CM

## 2013-02-18 DIAGNOSIS — F1920 Other psychoactive substance dependence, uncomplicated: Secondary | ICD-10-CM | POA: Diagnosis present

## 2013-02-18 LAB — RAPID URINE DRUG SCREEN, HOSP PERFORMED
AMPHETAMINES: NOT DETECTED
BARBITURATES: NOT DETECTED
BENZODIAZEPINES: POSITIVE — AB
COCAINE: NOT DETECTED
OPIATES: NOT DETECTED
TETRAHYDROCANNABINOL: NOT DETECTED

## 2013-02-18 MED ORDER — ACETAMINOPHEN 325 MG PO TABS: 650.0000 mg | ORAL_TABLET | ORAL | Status: DC | PRN

## 2013-02-18 MED ORDER — ACETAMINOPHEN 325 MG PO TABS
650.0000 mg | ORAL_TABLET | Freq: Four times a day (QID) | ORAL | Status: DC | PRN
  Administered 2013-02-19 – 2013-02-20 (×2): 650 mg via ORAL
  Filled 2013-02-18 (×2): qty 2

## 2013-02-18 MED ORDER — LORAZEPAM 1 MG PO TABS
1.0000 mg | ORAL_TABLET | Freq: Three times a day (TID) | ORAL | Status: DC | PRN
  Filled 2013-02-18: qty 1

## 2013-02-18 MED ORDER — NICOTINE 21 MG/24HR TD PT24: 21.0000 mg | MEDICATED_PATCH | Freq: Every day | TRANSDERMAL | Status: DC

## 2013-02-18 MED ORDER — ONDANSETRON 4 MG PO TBDP: 4.0000 mg | ORAL_TABLET | Freq: Four times a day (QID) | ORAL | Status: DC | PRN

## 2013-02-18 MED ORDER — NICOTINE POLACRILEX 2 MG MT GUM
2.0000 mg | CHEWING_GUM | OROMUCOSAL | Status: DC | PRN
  Administered 2013-02-18: 2 mg via ORAL
  Filled 2013-02-18: qty 1

## 2013-02-18 MED ORDER — ALUM & MAG HYDROXIDE-SIMETH 200-200-20 MG/5ML PO SUSP
30.0000 mL | ORAL | Status: DC | PRN
Start: 2013-02-18 — End: 2013-02-18

## 2013-02-18 MED ORDER — MAGNESIUM HYDROXIDE 400 MG/5ML PO SUSP
30.0000 mL | Freq: Every day | ORAL | Status: DC | PRN
Start: 2013-02-18 — End: 2013-02-20

## 2013-02-18 MED ORDER — THIAMINE HCL 100 MG/ML IJ SOLN: 100.0000 mg | Freq: Once | INTRAMUSCULAR | Status: DC

## 2013-02-18 MED ORDER — ADULT MULTIVITAMIN W/MINERALS CH
1.0000 | ORAL_TABLET | Freq: Every day | ORAL | Status: DC
  Administered 2013-02-18 – 2013-02-20 (×3): 1 via ORAL
  Filled 2013-02-18 (×5): qty 1

## 2013-02-18 MED ORDER — ZOLPIDEM TARTRATE 5 MG PO TABS: 5.0000 mg | ORAL_TABLET | Freq: Every evening | ORAL | Status: DC | PRN

## 2013-02-18 MED ORDER — IBUPROFEN 200 MG PO TABS: 600.0000 mg | ORAL_TABLET | Freq: Three times a day (TID) | ORAL | Status: DC | PRN

## 2013-02-18 MED ORDER — TRAZODONE HCL 100 MG PO TABS
100.0000 mg | ORAL_TABLET | Freq: Every evening | ORAL | Status: DC | PRN
  Administered 2013-02-18 (×2): 100 mg via ORAL
  Filled 2013-02-18: qty 20
  Filled 2013-02-18: qty 1

## 2013-02-18 MED ORDER — HYDROXYZINE HCL 25 MG PO TABS
25.0000 mg | ORAL_TABLET | Freq: Four times a day (QID) | ORAL | Status: DC | PRN
  Administered 2013-02-18 – 2013-02-19 (×4): 25 mg via ORAL
  Filled 2013-02-18 (×3): qty 1

## 2013-02-18 MED ORDER — NICOTINE POLACRILEX 2 MG MT GUM: 2.0000 mg | CHEWING_GUM | OROMUCOSAL | Status: DC | PRN

## 2013-02-18 MED ORDER — ALUM & MAG HYDROXIDE-SIMETH 200-200-20 MG/5ML PO SUSP
30.0000 mL | ORAL | Status: DC | PRN
Start: 2013-02-18 — End: 2013-02-20

## 2013-02-18 MED ORDER — LOPERAMIDE HCL 2 MG PO CAPS: 2.0000 mg | ORAL_CAPSULE | ORAL | Status: DC | PRN

## 2013-02-18 MED ORDER — CHLORDIAZEPOXIDE HCL 25 MG PO CAPS
25.0000 mg | ORAL_CAPSULE | Freq: Four times a day (QID) | ORAL | Status: DC | PRN
  Administered 2013-02-19 (×2): 25 mg via ORAL
  Filled 2013-02-18 (×4): qty 1

## 2013-02-18 MED ORDER — ONDANSETRON HCL 4 MG PO TABS: 4.0000 mg | ORAL_TABLET | Freq: Three times a day (TID) | ORAL | Status: DC | PRN

## 2013-02-18 MED ORDER — VITAMIN B-1 100 MG PO TABS
100.0000 mg | ORAL_TABLET | Freq: Every day | ORAL | Status: DC
  Administered 2013-02-19 – 2013-02-20 (×2): 100 mg via ORAL
  Filled 2013-02-18 (×3): qty 1

## 2013-02-18 NOTE — BH Assessment (Signed)
[facility] Assessment Progress Note     The Patient was admitted to [facility].  Room 307 bed 2.

## 2013-02-18 NOTE — ED Provider Notes (Signed)
Medical screening examination/treatment/procedure(s) were performed by non-physician practitioner and as supervising physician I was immediately available for consultation/collaboration.    Delora Fuel, MD 74/14/23 9532

## 2013-02-18 NOTE — Consult Note (Signed)
Evaluated this 28 year old Caucasian male with Dr Louretta Shorten  who is seeking detoxification from Benzodiazepine, Xanax and Hydrocodene.  Patient states he took 15 tablets  tablets out of 90 tablets of 1 mg  Klonopin prescribed by his outpatient provider in 24 hours.  Patient recently completed 51 days treatment and rehabilitation at [facility] in November/December 2014.  Patient states that lately he has been abusing Klonopin and Suboxone daily.  He reports relapsing a month after leaving [facility] because his parents are using drugs especially his mother.  He reports using Klonopine since age 28 for anxiety and he started abusing this medication and Xanax after his divorce.  Patient is interested in going to [place] for inpatient rehabilitation but will like a detox treatment first.  He is jittery, anxious rambling words at times and sleeping off and on.  He denies SI/HI/AVH but is asking for detox treatment.  We will place patient on our Librium protocol for his detox and plan to send his to [facility] for rehabilitation.  Diagnosis:, Benzodiazepine addiction, Opioid abuse,  Physical Assessment:  I will refer you to H/P performed by EDP today 02/19/2011 which is unremarkable.  No changes at this time.  Psychiatric Specialty Exam: Physical Exam  ROS  Blood pressure 113/77, pulse 84, temperature 98.3 F (36.8 C), temperature source Oral, resp. rate 20, SpO2 100.00%.There is no weight on file to calculate BMI.  General Appearance: Casual and Disheveled  Eye Contact::  Fair  Speech:  Garbled and Pressured  Volume:  Decreased  Mood:  Anxious  Affect:  Depressed  Thought Process:  Coherent, Goal Directed, Intact and a little sluggish and slow due to klonopin in the sysytem  Orientation:  Full (Time, Place, and Person)  Thought Content:  NA  Suicidal Thoughts:  No  Homicidal Thoughts:  No  Memory:  Immediate;   Good Recent;   Good Remote;   Good  Judgement:  Poor  Insight:  Good  Psychomotor  Activity:  Normal  Concentration:  Good  Recall:  NA  Akathisia:  NA  Handed:  Right  AIMS (if indicated):     Assets:  Desire for Improvement  Sleep:      Plan: Admit to inpatient Chemical dependency unit  For detox We will use Librium protocol for his detox We will encourage patient to participate in group and individual therapy.  Charmaine Downs   PMHNP-BC  Patient seen face to face this morning and case discussed with physician extender and reviewed the information documented and agree with the treatment plan.  Azadeh Hyder,JANARDHAHA R. 02/18/2013 4:10 PM

## 2013-02-18 NOTE — BH Assessment (Signed)
Tele Assessment Note   Zachary Madden is an 28 y.o. male who presents to [facility] Emergency Department with the chief complaint of excessive substance abuse. Patient reports that solely desires to get in the [facility] for detox as he states he has friends there that can support him. Patient states that he has been prescribed Klonopin since the age of 59 and that he has always suffered from heightened anxiety. Patient reports that he overly consumes Klonopin and Suboxone on a daily basis. Patient reported that he was being prescribed Klonopin from [facility] but now feels guilty for abusing his medications. Patient reports that his divorce and other unspecified life stressors to be the causation of his substance abuse. Patient reports that he has received inpatient treatment in the past by [facility] where he was there for 51 days. Patient currently resides with his parents but reports "My father is not supportive. He wants me to do good but that's it". Patient denies SI/HI/AVH at this time.   Axis I: Sedative, Hypnotic, or Anxiolytic Use Disorder Axis II: Deferred Axis III:  Past Medical History  Diagnosis Date  . Allergy   . Anxiety   . Cholelithiasis    Axis IV: other psychosocial or environmental problems, problems related to social environment and problems with primary support group Axis V: 41-50 serious symptoms  Past Medical History:  Past Medical History  Diagnosis Date  . Allergy   . Anxiety   . Cholelithiasis     History reviewed. No pertinent past surgical history.  Family History:  Family History  Problem Relation Age of Onset  . Anxiety disorder Mother   . Chronic fatigue Mother   . Heart disease Father   . Diabetes Father     Social History:  reports that he has quit smoking. He does not have any smokeless tobacco history on file. He reports that he uses illicit drugs (Benzodiazepines). He reports that he does not drink alcohol.  Additional  Social History:  Alcohol / Drug Use History of alcohol / drug use?: Yes Substance #1 Name of Substance 1: Benzodiazepines 1 - Age of First Use: 16 1 - Amount (size/oz): varies 1 - Frequency: daily 1 - Duration: years 1 - Last Use / Amount: 02/17/13-amount unspecified by patient   CIWA: CIWA-Ar BP: 113/77 mmHg Pulse Rate: 84 COWS:    Allergies: No Known Allergies  Home Medications:  (Not in a hospital admission)  OB/GYN Status:  No LMP for male patient.  General Assessment Data Location of Assessment: [facility] Is this a Tele or Face-to-Face Assessment?: Tele Assessment Is this an Initial Assessment or a Re-assessment for this encounter?: Initial Assessment Living Arrangements: Parent Can pt return to current living arrangement?: Yes Admission Status: Voluntary Is patient capable of signing voluntary admission?: Yes Transfer from: [facility] Referral Source: Self/Family/Friend     [place] Living Arrangements: Parent  Education Status Is patient currently in school?: No  Risk to self Suicidal Ideation: No-Not Currently/Within Last 6 Months Suicidal Intent: No Is patient at risk for suicide?: No Suicidal Plan?: No Access to Means: No What has been your use of drugs/alcohol within the last 12 months?: benzodiazepines Previous Attempts/Gestures: No How many times?: 0 Other Self Harm Risks: SA abuse Triggers for Past Attempts: None known Intentional Self Injurious Behavior: None Family Suicide History: No Recent stressful life event(s): Other (Comment) (Patient reports active substance abuse) Persecutory voices/beliefs?: No Depression: No Depression Symptoms: Guilt;Feeling worthless/self pity Substance  abuse history and/or treatment for substance abuse?: Yes  Risk to Others Homicidal Ideation: No Thoughts of Harm to Others: No Current Homicidal Intent: No Current Homicidal Plan: No Access to Homicidal Means: No Identified  Victim: None History of harm to others?: No Assessment of Violence: None Noted Violent Behavior Description: Pt is calm and cooperative Does patient have access to weapons?: No Criminal Charges Pending?: Yes Describe Pending Criminal Charges: Driving While License Revoked Does patient have a court date: Yes Court Date:  (Unknown by patient )  Psychosis Hallucinations: None noted Delusions: None noted  Mental Status Report Appear/Hygiene: Disheveled Eye Contact: Fair Motor Activity: Freedom of movement Speech: Logical/coherent Level of Consciousness: Alert Mood: Anxious Affect: Anxious Anxiety Level: Moderate Thought Processes: Coherent;Relevant Judgement: Impaired Orientation: Person;Place;Time;Situation Obsessive Compulsive Thoughts/Behaviors: None  Cognitive Functioning Concentration: Decreased Memory: Recent Intact;Remote Intact IQ: Average Insight: Poor Impulse Control: Poor Appetite: Good Weight Loss: 0 Weight Gain: 0 Sleep: Decreased Total Hours of Sleep: 3 Vegetative Symptoms: None  ADLScreening [facility] Assessment Services) Patient's cognitive ability adequate to safely complete daily activities?: Yes Patient able to express need for assistance with ADLs?: Yes Independently performs ADLs?: Yes (appropriate for developmental age)  Prior Inpatient Therapy Prior Inpatient Therapy: Yes Prior Therapy Dates: 2014 Prior Therapy Facilty/Provider(s): [facility]  Reason for Treatment: Detox  Prior Outpatient Therapy Prior Outpatient Therapy: No  ADL Screening (condition at time of admission) Patient's cognitive ability adequate to safely complete daily activities?: Yes Is the patient deaf or have difficulty hearing?: No Does the patient have difficulty seeing, even when wearing glasses/contacts?: No Does the patient have difficulty concentrating, remembering, or making decisions?: No Patient able to express need for assistance with ADLs?: Yes Does the patient have  difficulty dressing or bathing?: No Independently performs ADLs?: Yes (appropriate for developmental age) Does the patient have difficulty walking or climbing stairs?: No Weakness of Legs: None Weakness of Arms/Hands: None  Home Assistive Devices/Equipment Home Assistive Devices/Equipment: None  Therapy Consults (therapy consults require a physician order) PT Evaluation Needed: No OT Evalulation Needed: No SLP Evaluation Needed: No Abuse/Neglect Assessment (Assessment to be complete while patient is alone) Physical Abuse: Denies Verbal Abuse: Denies Sexual Abuse: Denies Exploitation of patient/patient's resources: Denies Self-Neglect: Denies Values / Beliefs Cultural Requests During Hospitalization: None Spiritual Requests During Hospitalization: None Consults Spiritual Care Consult Needed: No Social Work Consult Needed: No      Additional Information 1:1 In Past 12 Months?: No CIRT Risk: No Elopement Risk: No Does patient have medical clearance?: Yes     Disposition: Consulted with Serena Colonel, NP who states patient would benefit from inpatient detoxification. TTS to seek placement for detox.     Disposition Initial Assessment Completed for this Encounter: Yes  PICKETT JR, Nowell Sites C 02/18/2013 5:03 AM

## 2013-02-18 NOTE — Progress Notes (Signed)
Patient ID: Zachary Madden, male   DOB: 01/21/1985, 28 y.o.   MRN: [id number] Pt is a 28 year old voluntary first time admit to [facility]. He stated he was in [place] for over 51 days in November and was recently in [facility] for nine days for drug rehab. Pt admits he has been doing drugs since the 10th grade. He stated the herion usage did not start until 2 years ago. Pt only admits he has a hx of anxiety and is a smoker.He has NKDA and his last subtozone was on Tuesday. He does have a college degree form [employer]-Pt was walked on the unit and was given something to eat. He denies SI and HI and does contract for safety.

## 2013-02-18 NOTE — Progress Notes (Addendum)
Completed application for RTS, referral faxed for review.  Will follow up.    Zachary Madden Disposition [facility]

## 2013-02-18 NOTE — ED Provider Notes (Signed)
Labs and urinalysis completed. UDS positive for benzos. Patient hemodynamically stable and medically cleared for TTS eval.  0515 - TTS eval pending. Dispo to be determined by oncoming ED provider.  Antonietta Breach, PA-C 02/18/13 [phone]

## 2013-02-19 DIAGNOSIS — F1120 Opioid dependence, uncomplicated: Secondary | ICD-10-CM

## 2013-02-19 DIAGNOSIS — F411 Generalized anxiety disorder: Secondary | ICD-10-CM

## 2013-02-19 DIAGNOSIS — F1320 Sedative, hypnotic or anxiolytic dependence, uncomplicated: Principal | ICD-10-CM

## 2013-02-19 MED ORDER — HYDROXYZINE HCL 25 MG PO TABS
ORAL_TABLET | ORAL | Status: AC
  Filled 2013-02-19: qty 1

## 2013-02-19 MED ORDER — HYDROXYZINE HCL 50 MG PO TABS
50.0000 mg | ORAL_TABLET | Freq: Three times a day (TID) | ORAL | Status: DC | PRN
  Administered 2013-02-20: 50 mg via ORAL
  Filled 2013-02-19: qty 1

## 2013-02-19 NOTE — B[facility] Group Notes (Signed)
[place] Group Notes:  (Clinical Social Work)  02/19/2013  10:00-11:00AM  Summary of Progress/Problems:   The main focus of today's process group was to   identify the patient's current support system and decide on other supports that can be put in place.  The picture on workbook was used to discuss why additional supports are needed.  An emphasis was placed on using counselor, doctor, therapy groups, 12-step groups, and problem-specific support groups to expand supports.   There was also an extensive discussion about what constitutes a healthy support versus an unhealthy support.  The patient expressed full comprehension of the concepts presented, and agreed that there is a need to add more supports.  The patient stated the current supports in place are his family including a 26yo daughter, his NA home group, and the entire fellowship.  He also mentioned the peer and professional people in the group.  He enjoys motorcross and says that is a good support for him that he would like to return to.  He interjected a lot, mostly in a helpful manner, throughout group and was going to follow up with some individuals about his experience in a 12-step program.  Type of Therapy:  Process Group with Motivational Interviewing  Participation Level:  Active  Participation Quality:  Attentive, Sharing and Supportive  Affect:  Blunted  Cognitive:  Appropriate and Oriented  Insight:  Engaged  Engagement in Therapy:  Engaged  Modes of Intervention:   Education, Support and Processing, Activity  [employer], LCSW 02/19/2013, 12:15pm
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
## 2013-02-19 NOTE — Progress Notes (Signed)
The focus of this group is to educate the patient on the purpose and policies of crisis stabilization and provide a format to answer questions about their admission.  The group details unit policies and expectations of patients while admitted.  Patient attended 0900 nurse education orientation group this morning.  Patient actively participated, appropriate affect, alert, appropriate insight and engagement.  Today patient will work on 3 goals for discharge.  
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
## 2013-02-19 NOTE — BHH Group Notes (Signed)
[facility] Group Notes:  (Nursing/MHT/Case Management/Adjunct)  Date:  02/19/2013  Time:  2:28 PM  Type of Therapy:  Nurse Education  Participation Level:  Did Not Attend  Participation Quality:  Inattentive  Affect:  Depressed  Cognitive:  Lacking  Insight:  None  Engagement in Group:  None  Modes of Intervention:  Discussion  Summary of Progress/Problems:pt was with the NP discussing his medications  Delman Kitten 02/19/2013, 2:28 PM

## 2013-02-19 NOTE — Progress Notes (Signed)
[facility] Group Notes:  (Nursing/MHT/Case Management/Adjunct)  Date:  02/19/2013  Time:  6:39 PM  Type of Therapy:  Psychoeducational Skills  Participation Level:  Appropriate  Participation Quality:  Appropriate and Attentive  Affect:  Appropriate  Cognitive:  Appropriate  Insight:  Appropriate  Engagement in Group:  Engaged  Modes of Intervention:  Activity  Summary of Progress/Problems: Pts played an activity of Pictionary using coping skills. Pts were asked one coping skill they could use once they leave the hospital that works for them. Pt stated he enjoys running as his coping skill.  Clint Bolder 02/19/2013, 6:39 PM

## 2013-02-19 NOTE — Progress Notes (Signed)
Pt is asleep, unlabored, even breathing. No signs of distress noted. q 15 min safety checks, pt remain safe on the unit.

## 2013-02-19 NOTE — Progress Notes (Signed)
D: Pt seen sitting in dayroom engaging with others. Another pt let it be known to the MHT that pt was trying to disrupte medications that he has in his locker, this accusation has not been confirmed or denied. Writer spoke with pt and he was adamant about going to the [facility] and getting clean. Pt is somewhat med seeking in trying to see what he can get, but not aggressive about it. Denies si/hi/avh. Denies pain. Pt is asking for klonopin.  A: trazodone given for sleep along with a repeat dose. q 15 min safety checks R: pt remains safe on unit. No further complaints at this time.

## 2013-02-19 NOTE — [facility] Suicide Risk Assessment (Signed)
Suicide Risk Assessment  Admission Assessment     Nursing information obtained from:  Patient Demographic factors:  Male;Caucasian Current Mental Status:    Loss Factors:    Historical Factors:    Risk Reduction Factors:  Sense of responsibility to family;Religious beliefs about death;Living with another person, especially a relative;Positive social support Total Time spent with patient: 45 minutes  CLINICAL FACTORS:   Depression:   Anhedonia Insomnia Dysthymia Alcohol/Substance Abuse/Dependencies More than one psychiatric diagnosis Unstable or Poor Therapeutic Relationship  Psychiatric Specialty Exam:     Blood pressure 107/70, pulse 92, temperature 97.2 F (36.2 C), temperature source Oral, resp. rate 18, height 5\' 6"  (1.676 m), weight 70.761 kg (156 lb), SpO2 97.00%.Body mass index is 25.19 kg/(m^2).  General Appearance: Casual  Eye Contact::  Fair  Speech:  Slow  Volume:  Decreased  Mood:  Dysphoric  Affect:  Constricted  Thought Process:  Linear  Orientation:  Full (Time, Place, and Person)  Thought Content:  Rumination  Suicidal Thoughts:  No  Homicidal Thoughts:  No  Memory:  Recent;   Fair  Judgement:  Poor  Insight:  Lacking  Psychomotor Activity:  Decreased  Concentration:  Fair  Recall:  [employer] of Knowledge:Fair  Language: Fair  Akathisia:  Negative  Handed:  Right  AIMS (if indicated):     Assets:  Communication Skills Desire for Improvement Physical Health  Sleep:  Number of Hours: 5.25   Musculoskeletal: Strength & Muscle Tone: within normal limits Gait & Station: normal Patient leans: N/A  COGNITIVE FEATURES THAT CONTRIBUTE TO RISK:  Closed-mindedness Polarized thinking    SUICIDE RISK:   Mild:  Suicidal ideation of limited frequency, intensity, duration, and specificity.  There are no identifiable plans, no associated intent, mild dysphoria and related symptoms, good self-control (both objective and subjective assessment), few other  risk factors, and identifiable protective factors, including available and accessible social support.  PLAN OF CARE:  I certify that inpatient services furnished can reasonably be expected to improve the patient's condition.  Jakyra Kenealy 02/19/2013, 9:31 AM

## 2013-02-19 NOTE — Progress Notes (Signed)
[facility] Group Notes:  (Nursing/MHT/Case Management/Adjunct)  Date:  02/18/2013  Time:  2100 Type of Therapy:  wrap up group  Participation Level:  Active  Participation Quality:  Appropriate, Attentive, Monopolizing, Redirectable, Sharing and Supportive  Affect:  Appropriate  Cognitive:  Appropriate  Insight:  Good  Engagement in Group:  Engaged  Modes of Intervention:  Clarification, Education and Support  Summary of Progress/Problems:  Zachary Madden 02/19/2013, 2:41 AM

## 2013-02-19 NOTE — Progress Notes (Signed)
Pt is constantly at the med window asking for medications and insisting he should be getting ativan. Pt was given a librium at 12noon for his nerves but requested a visteral and was told it was not time for that medication. Pt once given the librium dropped it on the floor and staff asked him to pick it up so it could be wasted and another one reissued. Pt was asked to lift up his tongue to check for cheeking and was cooperative. He has been on the phone quite a bit this am. Pt does contract for safety and denies Si and HI.

## 2013-02-20 ENCOUNTER — Emergency Department (HOSPITAL_COMMUNITY)
Admission: EM | Admit: 2013-02-20 | Discharge: 2013-02-21 | Disposition: A | Discharge status: Home / Self Care | Payer: Self-pay | Attending: Emergency Medicine | Admitting: Emergency Medicine

## 2013-02-20 ENCOUNTER — Encounter (HOSPITAL_COMMUNITY): Payer: Self-pay | Admitting: Emergency Medicine

## 2013-02-20 DIAGNOSIS — Z87891 Personal history of nicotine dependence: Secondary | ICD-10-CM | POA: Insufficient documentation

## 2013-02-20 DIAGNOSIS — F411 Generalized anxiety disorder: Secondary | ICD-10-CM | POA: Diagnosis present

## 2013-02-20 DIAGNOSIS — F1120 Opioid dependence, uncomplicated: Secondary | ICD-10-CM

## 2013-02-20 DIAGNOSIS — K089 Disorder of teeth and supporting structures, unspecified: Secondary | ICD-10-CM | POA: Insufficient documentation

## 2013-02-20 DIAGNOSIS — K029 Dental caries, unspecified: Secondary | ICD-10-CM | POA: Insufficient documentation

## 2013-02-20 DIAGNOSIS — K0889 Other specified disorders of teeth and supporting structures: Secondary | ICD-10-CM

## 2013-02-20 DIAGNOSIS — F1320 Sedative, hypnotic or anxiolytic dependence, uncomplicated: Secondary | ICD-10-CM | POA: Insufficient documentation

## 2013-02-20 DIAGNOSIS — F410 Panic disorder [episodic paroxysmal anxiety] without agoraphobia: Secondary | ICD-10-CM | POA: Diagnosis present

## 2013-02-20 HISTORY — DX: Mental disorder, not otherwise specified: F99

## 2013-02-20 LAB — CBC WITH DIFFERENTIAL/PLATELET
BASOS ABS: 0 10*3/uL (ref 0.0–0.1)
Basophils Relative: 0 % (ref 0–1)
Eosinophils Absolute: 0.2 10*3/uL (ref 0.0–0.7)
Eosinophils Relative: 3 % (ref 0–5)
HCT: 37.8 % — ABNORMAL LOW (ref 39.0–52.0)
HEMOGLOBIN: 13.4 g/dL (ref 13.0–17.0)
LYMPHS PCT: 34 % (ref 12–46)
Lymphs Abs: 2.3 10*3/uL (ref 0.7–4.0)
MCH: 31.8 pg (ref 26.0–34.0)
MCHC: 35.4 g/dL (ref 30.0–36.0)
MCV: 89.8 fL (ref 78.0–100.0)
MONO ABS: 0.5 10*3/uL (ref 0.1–1.0)
Monocytes Relative: 7 % (ref 3–12)
NEUTROS ABS: 3.9 10*3/uL (ref 1.7–7.7)
Neutrophils Relative %: 56 % (ref 43–77)
PLATELETS: 275 10*3/uL (ref 150–400)
RBC: 4.21 MIL/uL — AB (ref 4.22–5.81)
RDW: 12.4 % (ref 11.5–15.5)
WBC: 7 10*3/uL (ref 4.0–10.5)

## 2013-02-20 LAB — COMPREHENSIVE METABOLIC PANEL
ALK PHOS: 101 U/L (ref 39–117)
ALT: 22 U/L (ref 0–53)
AST: 23 U/L (ref 0–37)
Albumin: 3.7 g/dL (ref 3.5–5.2)
BUN: 14 mg/dL (ref 6–23)
CHLORIDE: 97 meq/L (ref 96–112)
CO2: 29 meq/L (ref 19–32)
Calcium: 9.2 mg/dL (ref 8.4–10.5)
Creatinine, Ser: 0.8 mg/dL (ref 0.50–1.35)
GFR calc Af Amer: >90 mL/min (ref >90)
GFR calc non Af Amer: >90 mL/min (ref >90)
Glucose, Bld: 95 mg/dL (ref 70–99)
Potassium: 4.1 mEq/L (ref 3.7–5.3)
SODIUM: 136 meq/L — AB (ref 137–147)
Total Protein: 7.1 g/dL (ref 6.0–8.3)

## 2013-02-20 LAB — RAPID URINE DRUG SCREEN, HOSP PERFORMED
Amphetamines: NOT DETECTED
BARBITURATES: NOT DETECTED
Benzodiazepines: POSITIVE — AB
Cocaine: NOT DETECTED
Opiates: NOT DETECTED
TETRAHYDROCANNABINOL: NOT DETECTED

## 2013-02-20 LAB — ETHANOL: Alcohol, Ethyl (B): <11 mg/dL (ref 0–11)

## 2013-02-20 MED ORDER — NICOTINE 21 MG/24HR TD PT24
21.0000 mg | MEDICATED_PATCH | Freq: Every day | TRANSDERMAL | Status: DC
  Administered 2013-02-20: 21 mg via TRANSDERMAL
  Filled 2013-02-20 (×2): qty 1

## 2013-02-20 MED ORDER — IBUPROFEN 200 MG PO TABS
600.0000 mg | ORAL_TABLET | Freq: Three times a day (TID) | ORAL | Status: DC | PRN
  Administered 2013-02-20 – 2013-02-21 (×3): 600 mg via ORAL
  Filled 2013-02-20 (×3): qty 3

## 2013-02-20 MED ORDER — BENZOCAINE 10 % MT GEL
Freq: Four times a day (QID) | OROMUCOSAL | Status: DC | PRN
  Filled 2013-02-20: qty 9.4

## 2013-02-20 MED ORDER — LORAZEPAM 1 MG PO TABS: 0.0000 mg | ORAL_TABLET | Freq: Two times a day (BID) | ORAL | Status: DC

## 2013-02-20 MED ORDER — ADULT MULTIVITAMIN W/MINERALS CH
1.0000 | ORAL_TABLET | Freq: Every day | ORAL | Status: DC
  Administered 2013-02-20 – 2013-02-21 (×2): 1 via ORAL
  Filled 2013-02-20 (×2): qty 1

## 2013-02-20 MED ORDER — ALUM & MAG HYDROXIDE-SIMETH 200-200-20 MG/5ML PO SUSP: 30.0000 mL | ORAL | Status: DC | PRN

## 2013-02-20 MED ORDER — ZOLPIDEM TARTRATE 5 MG PO TABS: 5.0000 mg | ORAL_TABLET | Freq: Every evening | ORAL | Status: DC | PRN

## 2013-02-20 MED ORDER — LORAZEPAM 1 MG PO TABS: 0.0000 mg | ORAL_TABLET | Freq: Four times a day (QID) | ORAL | Status: DC

## 2013-02-20 MED ORDER — TRAZODONE HCL 100 MG PO TABS: 100.0000 mg | ORAL_TABLET | Freq: Every evening | ORAL | Status: DC | PRN

## 2013-02-20 MED ORDER — ACETAMINOPHEN 325 MG PO TABS: 650.0000 mg | ORAL_TABLET | ORAL | Status: DC | PRN

## 2013-02-20 MED ORDER — PENICILLIN V POTASSIUM 500 MG PO TABS
500.0000 mg | ORAL_TABLET | Freq: Four times a day (QID) | ORAL | Status: DC
  Administered 2013-02-20 – 2013-02-21 (×4): 500 mg via ORAL
  Filled 2013-02-20 (×4): qty 1

## 2013-02-20 MED ORDER — ONDANSETRON HCL 4 MG PO TABS: 4.0000 mg | ORAL_TABLET | Freq: Three times a day (TID) | ORAL | Status: DC | PRN

## 2013-02-20 MED ORDER — ADULT MULTIVITAMIN W/MINERALS CH: 1.0000 | ORAL_TABLET | Freq: Every day | ORAL | Status: AC

## 2013-02-20 NOTE — Progress Notes (Signed)
Report received from Mindi Slicker RN. Patient currently lying in bed asleep with eyes closed and respirations even and unlabored. No distress noted. Safety maintained with 15 min checks.

## 2013-02-20 NOTE — Progress Notes (Signed)
Pt d/c ambulatory, no signs of distress or verbal complaints. D/c paper explained, along with prescription and medications. Denies si/hi/avh. Denies pain

## 2013-02-20 NOTE — Tx Team (Signed)
Interdisciplinary Treatment Plan Update (Adult)  Date: 02/20/2013   Time Reviewed: 11:28 AM  Progress in Treatment:  Attending groups: yes  Participating in groups:  Yes  Taking medication as prescribed: Yes  Tolerating medication: Yes  Family/Significant othe contact made: No. SPE not required for this pt.   Patient understands diagnosis: Yes, AEB seeking treatment for mood stabilization/substance abuse.  Discussing patient identified problems/goals with staff: Yes  Medical problems stabilized or resolved: Yes  Denies suicidal/homicidal ideation: Yes during admission/self report/group.  Patient has not harmed self or Others: Yes  New problem(s) identified: Pt will be provided with bus pass.  Discharge Plan or Barriers: Pt to d/c to [facility] [place] and will follow up with his PCP for med management. Pt refusing referral for therapy but plans to attend NA and reconnect with his sponsor.  Additional comments: Zachary Madden is a 28 year old Caucasian male who is seeking detoxification from Benzodiazepine, Xanax and Hydrocodone. Previously reported he took 15 tablets tablets out of 90 tablets of 1 mg Klonopin prescribed by his outpatient provider in 24 hours, he now denies any truth to this. Patient states he was dropped off at [facility], by some guys from the [facility]. " I came here for detox for the [place], they require you to be clean to come into the home." Patient recently completed 51 days treatment and rehabilitation at [facility] in November/December 2014, states his clean date was 10/8. Patient states that lately he has been abusing Klonopin and Suboxone daily. He reports relapsing a month after leaving [facility] because his parents are using drugs and alcohol especially his mother. He reports using Klonopin since age 33 for anxiety and he started abusing this medication and Xanax after his divorce. He is jittery, anxious rambling words at times.He is admant about resuming his personal  medications that include Klonipin and Ambien. He states "they want accept my prescription meds, they gave me Ativan across the street, and I take 6mg  of Klonipin a day. I dont abuse medicine , I just take it prevent panic attacks." Upon clarification pt was asked if he needs detox from benzodiazepines? Pt states that "Grayland Ormond at [facility] takes Xanax, and I know I can get by with benzo's as long as I take it as prescribed." He denies SI/HI/AVH and is able to contract for safety.  Reason for Continuation of Hospitalization: d/c today  Estimated length of stay:  D/c today  For review of initial/current patient goals, please see plan of care.  Attendees:  Patient:    Family:    Physician: Carlton Adam MD 02/20/2013 11:28 AM   Nursing: Marliss Czar RN 02/20/2013 11:28 AM   Clinical Social Worker [place], [place]  02/20/2013 11:28 AM   Other: Anderson Malta RN  02/20/2013 11:28 AM   Other: Hardie Pulley. PA 02/20/2013 11:28 AM   Other: Gerline Legacy Nurse CM  02/20/2013 11:28 AM   Other:    Scribe for Treatment Team:  [place] 02/20/2013 11:28 AM

## 2013-02-20 NOTE — Progress Notes (Signed)
[facility] Adult Case Management Discharge Plan :  Will you be returning to the same living situation after discharge: No. Pt accepted to [facility] on [facility]. And plans to go straight there today At discharge, do you have transportation home?:Yes,  provided pt with bus passes and directions on how to get there Do you have the ability to pay for your medications:Yes,  provided pt with prescriptions and pt verbalizes ability to afford meds  Release of information consent forms completed and in the chart;  Patient's signature needed at discharge.  Patient to Follow up at: Follow-up Information   Follow up with [facility] On 02/27/2013. (Appointment scheduled at 12:00 pm on this date with Dr. Stevie Kern for medication management)    Contact information:   [place], [address] [place] Phone: [phone] Fax: [phone]       Patient denies SI/HI:   Yes,  denies SI/HI    Safety Planning and Suicide Prevention discussed:  Yes,  discussed with pt.  N/A to contact family/friend due to no SI on admission.   Ane Payment 02/20/2013, 11:41 AM

## 2013-02-20 NOTE — ED Notes (Signed)
Pt has in belonging bag has black sunglasses, green watch, blue/red bracelets, light brownish shoes, grey pants, grey long sleeves shirt, white t-shirt, grey socks, red lighter, two keys, black cell phone, blue wallet, [place] can, Visa debt card, passport, black jacket, grey sweat pants

## 2013-02-20 NOTE — [facility] Suicide Risk Assessment (Signed)
Suicide Risk Assessment  Discharge Assessment     Demographic Factors:  Male and Caucasian  Total Time spent with patient: 45 minutes  Psychiatric Specialty Exam:     Blood pressure 98/63, pulse 101, temperature 97.6 F (36.4 C), temperature source Oral, resp. rate 18, height 5\' 6"  (1.676 m), weight 70.761 kg (156 lb), SpO2 97.00%.Body mass index is 25.19 kg/(m^2).  General Appearance: Fairly Groomed  Engineer, water::  Fair  Speech:  Clear and Coherent  Volume:  Normal  Mood:  Euthymic  Affect:  Appropriate  Thought Process:  Coherent and Goal Directed  Orientation:  Full (Time, Place, and Person)  Thought Content:  events, worries, concerns  Suicidal Thoughts:  No  Homicidal Thoughts:  No  Memory:  Immediate;   Fair Recent;   Fair Remote;   Fair  Judgement:  Fair  Insight:  Fair  Psychomotor Activity:  Normal  Concentration:  Fair  Recall:  [employer] of [place]  Language: Fair  Akathisia:  No  Handed:    AIMS (if indicated):     Assets:  Desire for Improvement Housing  Sleep:  Number of Hours: 5.5    Musculoskeletal: Strength & Muscle Tone: within normal limits Gait & Station: normal Patient leans: N/A   Mental Status Per Nursing Assessment::   On Admission:     Current Mental Status by Physician: NA  Loss Factors: NA  Historical Factors: NA  Risk Reduction Factors:   Living with another person, especially a relative and Positive social support  Continued Clinical Symptoms:  Panic Attacks Alcohol/Substance Abuse/Dependencies  Cognitive Features That Contribute To Risk:  Closed-mindedness Polarized thinking Thought constriction (tunnel vision)    Suicide Risk:  Minimal: No identifiable suicidal ideation.  Patients presenting with no risk factors but with morbid ruminations; may be classified as minimal risk based on the severity of the depressive symptoms  Discharge Diagnoses:   AXIS I:  Benzodiazepine dependence, Opioid Dependence,  GAD, Panic Attacks AXIS II:  Deferred AXIS III:   Past Medical History  Diagnosis Date  . Allergy   . Anxiety   . Cholelithiasis    AXIS IV:  other psychosocial or environmental problems AXIS V:  61-70 mild symptoms  Plan Of Care/Follow-up recommendations:  Activity:  as toerated Diet:  rgular Follow up outpatient/NA Is patient on multiple antipsychotic therapies at discharge:  No   Has Patient had three or more failed trials of antipsychotic monotherapy by history:  No  Recommended Plan for Multiple Antipsychotic Therapies: NA    Zachary Madden A 02/20/2013, 12:36 PM

## 2013-02-20 NOTE — ED Notes (Signed)
Pt requesting detox from benzo's. Pt last used klonipin 4mg  (1mg  tablets). Pt states that he is prescribed 6mg  a day and takes 8-10mg . Pt seems sluggish in speech. Pt denies SI/HI.

## 2013-02-20 NOTE — [facility] Counselor (Addendum)
Writer spoke with Baker Janus, NP regarding pt's criteria for [facility] involvement.  Writer stated he had reviewed earlier documentation from [facility], [place].  Writer noted the pt had been released from [facility] around 1230 today after being treated for Benzo dependence and presented to the ED @ 1708 with the same complaint after being rejected for housing.  Pt denies SI/HI and psychosis at 2100.  Pt does not meet criteria for [facility] involvement at this time.

## 2013-02-20 NOTE — BH Assessment (Signed)
Spoke with Dr.Bednar to obtain clinicals prior to assessing patient.   Shaune Pollack, MS, [place] Assessment Counselor

## 2013-02-20 NOTE — Discharge Summary (Signed)
Physician Discharge Summary Note  Patient:  Zachary Madden is an 28 y.o., male MRN:  [id number]  DOB:  11-Mar-1985  Patient phone:  [phone] (home)   Patient address:   [address] [place] [address],   Total Time spent with patient: Greater than 30 minutes  Date of Admission:  02/18/2013  Date of Discharge: 02/20/13  Reason for Admission:  Opioid/benzodiazepine detox  Discharge Diagnoses: Active Problems:   Benzodiazepine dependence, continuous   Opiate dependence   GAD (generalized anxiety disorder)   Panic attacks   Psychiatric Specialty Exam: Physical Exam  Constitutional: He is oriented to person, place, and time. He appears well-developed.  HENT:  Head: Normocephalic.  Eyes: Pupils are equal, round, and reactive to light.  Neck: Normal range of motion.  Cardiovascular: Normal rate.   Respiratory: Effort normal.  GI: Soft.  Genitourinary:  Denies any problems in this area.  Musculoskeletal: Normal range of motion.  Neurological: He is alert and oriented to person, place, and time.  Skin: Skin is warm and dry.  Psychiatric: His speech is normal and behavior is normal. Thought content normal. His mood appears not anxious (stable). His affect is not angry, not blunt, not labile and not inappropriate. Cognition and memory are normal. He expresses impulsivity. He does not exhibit a depressed mood.    Review of Systems  Constitutional: Negative.   HENT: Negative.   Eyes: Negative.   Respiratory: Negative.   Cardiovascular: Negative.   Gastrointestinal: Negative.   Genitourinary: Negative.   Musculoskeletal: Negative.   Skin: Negative.   Neurological: Negative.   Endo/Heme/Allergies: Negative.   Psychiatric/Behavioral: Positive for substance abuse (Opiate/benzodiazepine dependence). Negative for depression, suicidal ideas, hallucinations and memory loss. The patient is nervous/anxious (Stable) and has insomnia (Stable).     Blood pressure 98/63, pulse  101, temperature 97.6 F (36.4 C), temperature source Oral, resp. rate 18, height $RemoveBe'5\' 6"'AQsiNnDsQ$  (1.676 m), weight 70.761 kg (156 lb), SpO2 97.00%.Body mass index is 25.19 kg/(m^2).  General Appearance: Casual  Eye Contact::  Good  Speech:  Clear and Coherent  Volume:  Normal  Mood:  Stable  Affect:  Appropriate and Congruent  Thought Process:  Coherent and Goal Directed  Orientation:  Full (Time, Place, and Person)  Thought Content:  Denies any hallucinations, delusions and or paranoia  Suicidal Thoughts:  No  Homicidal Thoughts:  No  Memory:  Immediate;   Good Recent;   Good Remote;   Good  Judgement:  Intact  Insight:  Good  Psychomotor Activity:  Normal  Concentration:  Good  Recall:  Good  Fund of Knowledge:Good  Language: Good  Akathisia:  No  Handed:  Right  AIMS (if indicated):     Assets:  Desire for Improvement  Sleep:  Number of Hours: 5.5    Past Psychiatric History: Diagnosis: Opioid dependence, Benzodiazepine dependence  Hospitalizations: [facility]  Outpatient Care:  Substance Abuse Care:  Self-Mutilation:  Suicidal Attempts:  Violent Behaviors:   Musculoskeletal: Strength & Muscle Tone: within normal limits Gait & Station: normal Patient leans: N/A  DSM5: Schizophrenia Disorders:  NA Obsessive-Compulsive Disorders:  NA Trauma-Stressor Disorders:  Generalized anxiety disorder Substance/Addictive Disorders:  Opioid Disorder - Severe (304.00), Benzodiazepine dependence Depressive Disorders:  NA  Axis Diagnosis:   AXIS I:   Opioid Disorder - Severe (304.00), Benzodiazepine dependence, Generalized anxiety disorder AXIS II:  Deferred AXIS III:   Past Medical History  Diagnosis Date  . Allergy   . Anxiety   . Cholelithiasis  AXIS IV:  other psychosocial or environmental problems and polysubstance dependence AXIS V:  62  Level of Care:  OP  Hospital Course:  Bassem is a 28 year old Caucasian male who is seeking detoxification from Benzodiazepine, Xanax and  Hydrocodone. Previously reported he took 15 tablets tablets out of 90 tablets of 1 mg Klonopin prescribed by his outpatient provider in 24 hours, he now denies any truth to this. Patient states he was dropped off at [facility], by some guys from the [facility]. " I came here for detox for the [facility] [facility], they require you to be clean to come into the home." Patient recently completed 51 days treatment and rehabilitation at [facility] in November/December 2014, states his clean date was 10/8. Patient states that lately he has been abusing Klonopin and Suboxone daily. He reports relapsing a month after leaving [facility] because his parents are using drugs and alcohol especially his mother. He reports using Klonopin since age 51 for anxiety and he started abusing this medication and Xanax after his divorce. He is jittery, anxious rambling words at times. He is admant about resuming his personal medications that include Klonipin and Ambien.  Ashleigh's stay in this hospital was rather brief. He came in requesting detoxification treatment for opiates and benzodiazepine intoxications. Although claimed was abusing Opiates as well as benzodiazepine, his UDS reported indicated only positive benzodiazepine. He did not receive the complete Librium detoxification treatment protocols, rather, he was put on Librium 25 mg on PRN basis and Hydroxyzine 50 mg prn for substance related withdrawal anxiety. Zayvien did not report any issues with depression, however, has a history of Generalized anxiety disorder. And for this reason, he did not receive any mood stabilizing agents. He did receiveTrazodone 100 mg Q bedtime for sleep.  Diondre is stabilized from the withdrawal symptoms of benzodiazepine. He is currently being discharged to the [facility] here in [place], [place]. He will leave [facility] and will go straight to the [facility]. Danish came to the treatment team meeting this am and met with the treatment team staff. His reason for  admission, present symptoms, substance abuse issues, response to treatment and discharge plans discussed. Gurjit endorsed that he is doing well and stable to be discharged to follow-up care on an outpatient basis at the [facility] care on 02/09/215 12:00 Noon. The address, date, time and contact information for this appointment provided for patient in writing.  Upon discharge, patient adamantly denies any suicidal, homicidal ideations, auditory, visual hallucinations, delusions, paranoia and or withdrawal symptoms. Wasyl received 14 days worth supply samples of his [facility] discharge medications. He left [facility] with all personal belongings in no apparent distress.Transportation per [place] bus. Bus pass provided by [facility].  Consults:  psychiatry  Significant Diagnostic Studies:  labs: Completed, reviewed and stable  Discharge Vitals:   Blood pressure 98/63, pulse 101, temperature 97.6 F (36.4 C), temperature source Oral, resp. rate 18, height 5\' 6"  (1.676 m), weight 70.761 kg (156 lb), SpO2 97.00%. Body mass index is 25.19 kg/(m^2). Lab Results:   Results for orders placed during the hospital encounter of 02/17/13 (from the past 72 hour(s))  CBC WITH DIFFERENTIAL     Status: Abnormal   Collection Time    02/17/13  7:11 PM      Result Value Range   WBC 10.6 (*) 4.0 - 10.5 K/uL   RBC 4.69  4.22 - 5.81 MIL/uL   Hemoglobin 15.0  13.0 - 17.0 g/dL   HCT 02/19/13  01.7 -  52.0 %   MCV 88.9  78.0 - 100.0 fL   MCH 32.0  26.0 - 34.0 pg   MCHC 36.0  30.0 - 36.0 g/dL   RDW 12.8  11.5 - 15.5 %   Platelets 337  150 - 400 K/uL   Neutrophils Relative % 67  43 - 77 %   Neutro Abs 7.1  1.7 - 7.7 K/uL   Lymphocytes Relative 23  12 - 46 %   Lymphs Abs 2.5  0.7 - 4.0 K/uL   Monocytes Relative 9  3 - 12 %   Monocytes Absolute 1.0  0.1 - 1.0 K/uL   Eosinophils Relative 1  0 - 5 %   Eosinophils Absolute 0.1  0.0 - 0.7 K/uL   Basophils Relative 0  0 - 1 %   Basophils Absolute 0.0  0.0 - 0.1 K/uL  COMPREHENSIVE  METABOLIC PANEL     Status: Abnormal   Collection Time    02/17/13  7:11 PM      Result Value Range   Sodium 139  137 - 147 mEq/L   Potassium 3.9  3.7 - 5.3 mEq/L   Chloride 101  96 - 112 mEq/L   CO2 23  19 - 32 mEq/L   Glucose, Bld 129 (*) 70 - 99 mg/dL   BUN 14  6 - 23 mg/dL   Creatinine, Ser 0.91  0.50 - 1.35 mg/dL   Calcium 9.7  8.4 - 10.5 mg/dL   Total Protein 8.4 (*) 6.0 - 8.3 g/dL   Albumin 4.6  3.5 - 5.2 g/dL   AST 21  0 - 37 U/L   ALT 17  0 - 53 U/L   Alkaline Phosphatase 101  39 - 117 U/L   Total Bilirubin 0.4  0.3 - 1.2 mg/dL   GFR calc non Af Amer >90  >90 mL/min   GFR calc Af Amer >90  >90 mL/min   Comment: (NOTE)     The eGFR has been calculated using the CKD EPI equation.     This calculation has not been validated in all clinical situations.     eGFR's persistently <90 mL/min signify possible Chronic Kidney     Disease.  ETHANOL     Status: None   Collection Time    02/17/13  7:11 PM      Result Value Range   Alcohol, Ethyl (B) <11  0 - 11 mg/dL   Comment:            LOWEST DETECTABLE LIMIT FOR     SERUM ALCOHOL IS 11 mg/dL     FOR MEDICAL PURPOSES ONLY  SALICYLATE LEVEL     Status: Abnormal   Collection Time    02/17/13  7:11 PM      Result Value Range   Salicylate Lvl <8.6 (*) 2.8 - 20.0 mg/dL  ACETAMINOPHEN LEVEL     Status: None   Collection Time    02/17/13  7:11 PM      Result Value Range   Acetaminophen (Tylenol), Serum <15.0  10 - 30 ug/mL   Comment:            THERAPEUTIC CONCENTRATIONS VARY     SIGNIFICANTLY. A RANGE OF 10-30     ug/mL MAY BE AN EFFECTIVE     CONCENTRATION FOR MANY PATIENTS.     HOWEVER, SOME ARE BEST TREATED     AT CONCENTRATIONS OUTSIDE THIS     RANGE.     ACETAMINOPHEN CONCENTRATIONS     >  150 ug/mL AT 4 HOURS AFTER     INGESTION AND >50 ug/mL AT 12     HOURS AFTER INGESTION ARE     OFTEN ASSOCIATED WITH TOXIC     REACTIONS.  URINE RAPID DRUG SCREEN (HOSP PERFORMED)     Status: Abnormal   Collection Time     02/18/13  2:21 AM      Result Value Range   Opiates NONE DETECTED  NONE DETECTED   Cocaine NONE DETECTED  NONE DETECTED   Benzodiazepines POSITIVE (*) NONE DETECTED   Amphetamines NONE DETECTED  NONE DETECTED   Tetrahydrocannabinol NONE DETECTED  NONE DETECTED   Barbiturates NONE DETECTED  NONE DETECTED   Comment:            DRUG SCREEN FOR MEDICAL PURPOSES     ONLY.  IF CONFIRMATION IS NEEDED     FOR ANY PURPOSE, NOTIFY LAB     WITHIN 5 DAYS.                LOWEST DETECTABLE LIMITS     FOR URINE DRUG SCREEN     Drug Class       Cutoff (ng/mL)     Amphetamine      1000     Barbiturate      200     Benzodiazepine   914     Tricyclics       782     Opiates          300     Cocaine          300     THC              50    Physical Findings: AIMS:  , ,  ,  ,    CIWA:  CIWA-Ar Total: 0 COWS:     Psychiatric Specialty Exam: See Psychiatric Specialty Exam and Suicide Risk Assessment completed by Attending Physician prior to discharge.  Discharge destination:  Other:  [facility]  Is patient on multiple antipsychotic therapies at discharge:  No   Has Patient had three or more failed trials of antipsychotic monotherapy by history:  No  Recommended Plan for Multiple Antipsychotic Therapies: NA       Future Appointments Provider Department Dept Phone   02/27/2013 12:00 PM Dorena Cookey, MD [facility]       Medication List    STOP taking these medications       FLUoxetine 20 MG tablet  Commonly known as:  PROZAC      TAKE these medications     Indication   multivitamin with minerals Tabs tablet  Take 1 tablet by mouth daily. For low vitamin   Indication:  Low vitamin     traZODone 100 MG tablet  Commonly known as:  DESYREL  Take 1 tablet (100 mg total) by mouth at bedtime as needed and may repeat dose one time if needed for sleep.   Indication:  Trouble Sleeping       Follow-up Information   Follow up with [facility] On 02/27/2013. (Appointment scheduled at 12:00 pm on this date with Dr. Stevie Kern for medication management)    Contact information:   [place], [place] Phone: [phone] Fax: [phone]     Follow-up recommendations: Activity:  As tolerated Diet: As recommended by your primary care doctor. Keep all scheduled follow-up appointments as recommended.   Continue to work  your relapse prevention plan Comments: Take all your medications as prescribed by your mental healthcare provider. Report any adverse effects and or reactions from your medicines to your outpatient provider promptly. Patient is instructed and cautioned to not engage in alcohol and or illegal drug use while on prescription medicines. In the event of worsening symptoms, patient is instructed to call the crisis hotline, 911 and or go to the nearest ED for appropriate evaluation and treatment of symptoms. Follow-up with your primary care provider for your other medical issues, concerns and or health care needs.   Total Discharge Time:  Greater than 30 minutes.  Signed: Encarnacion Slates, PMHNP, FNP-BC 02/20/2013, 1:21 PM Agree with assessment and plan Woodroe Chen. Sabra Heck, M.D.

## 2013-02-20 NOTE — ED Notes (Signed)
Pt transferred from triage, presents for Detox from Benzo's.  Pt reports he takes Klonopin and Suboxone and Marijuana.  Denies alcohol abuse.  Denies SI, HI or AV hallucinations.  Pt reports he went to [facility] today, was not accepted.  Pt calm & cooperative, slurred speech, sleepy at present.

## 2013-02-20 NOTE — Progress Notes (Signed)
Patient ID: Zachary Madden, male   DOB: May 21, 1985, 28 y.o.   MRN: [id number]  D: Patient pleasant on approach this am. Reports depression and hopelessness at a "3". Currently denies any SI. Possible discharge today. A: Staff will monitor on q 15 minute checks, follow treatment plan, and give meds as ordered. R: Cooperative on unit. Vistaril for anxiety.

## 2013-02-20 NOTE — [facility] Suicide Risk Assessment (Signed)
[facility] INPATIENT: Family/Significant Other Suicide Prevention Education   Suicide Prevention Education:  Education Completed; No one has been identified by the patient as the family member/significant other with whom the patient will be residing, and identified as the person(s) who will aid the patient in the event of a mental health crisis (suicidal ideations/suicide attempt).   Pt did not c/o SI at admission, nor have they endorsed SI during their stay here. SPE not required. SPI pamphlet provided to pt and he was encouraged to share information with his support network, ask questions, and talk about any concerns.   The suicide prevention education provided includes the following:  Suicide risk factors  Suicide prevention and interventions  National Suicide Hotline telephone number  [facility] assessment telephone number  [facility] Emergency Assistance [place] and/or Residential Mobile Crisis Unit telephone number  Regan Lemming, [place] 02/20/2013  10:31 AM

## 2013-02-20 NOTE — BH Assessment (Signed)
Tele Assessment Note   Zachary Madden is an 28 y.o. male.  Pt presents to [facility] today after being discharged several hours prior from [facility]. Pt presents with C/O medical clearance and detox from Benzodiazepines that he his currently prescribed by a doctor. Pt reports that when he was discharged earlier today he followed up with the [facility]  and they turned him away because he had Klonopin. Pt is now requesting to be detoxed from everything to include all his Benzo's. Pt reports that he is homeless and has nowhere to go.  Pt reports his last use of Klonopin was on 02-20-13 in which patient reports that he consumed 2 doses which include 4 Klonopin pills. Pt denies current SI,HI, and no AVH reported.   Consulted with Psychiatric Extender who is recommending that patient be evaluated by Psychiatrist in the morning to determine disposition. Informed Dr. Stevie Kern EDP of this plan whom is in agreement.  Current Disposition: Pending Psychiatrist Consult in the morning.   Axis I: Benzodiazepine Dependencen,Opioid Dependence,GAD Axis II: Deferred Axis III:  Past Medical History  Diagnosis Date  . Allergy   . Anxiety   . Cholelithiasis    Axis IV: housing problems, other psychosocial or environmental problems, problems related to legal system/crime and problems with primary support group Axis V: 41-50 serious symptoms  Past Medical History:  Past Medical History  Diagnosis Date  . Allergy   . Anxiety   . Cholelithiasis     History reviewed. No pertinent past surgical history.  Family History:  Family History  Problem Relation Age of Onset  . Anxiety disorder Mother   . Chronic fatigue Mother   . Heart disease Father   . Diabetes Father     Social History:  reports that he has quit smoking. His smokeless tobacco use includes Snuff. He reports that he uses illicit drugs (Benzodiazepines). He reports that he does not drink alcohol.  Additional Social History:     CIWA: CIWA-Ar BP:  116/87 mmHg Pulse Rate: 73 COWS:    Allergies: No Known Allergies  Home Medications:  (Not in a hospital admission)  OB/GYN Status:  No LMP for male patient.  General Assessment Data Location of Assessment: [facility] Assessment Services Is this a Tele or Face-to-Face Assessment?: Tele Assessment Is this an Initial Assessment or a Re-assessment for this encounter?: Initial Assessment Living Arrangements: Other (Comment) (homeless) Can pt return to current living arrangement?: Yes Admission Status: Voluntary Is patient capable of signing voluntary admission?: Yes Transfer from: Other (Comment) Referral Source: Other Technical brewer)     [facility] Crisis Care Plan Living Arrangements: Other (Comment) (homeless) Name of Psychiatrist: No Current Provider Name of Therapist: No Current Provider     Risk to self Suicidal Ideation: No Suicidal Intent: No Is patient at risk for suicide?: No Suicidal Plan?: No Access to Means: No What has been your use of drugs/alcohol within the last 12 months?: Prior hx of Heroin,THC, noted benzo's prescribed but pt requesting to be detoxed from Benzo's Previous Attempts/Gestures: No How many times?: 0 Other Self Harm Risks: none reported Triggers for Past Attempts: None known Intentional Self Injurious Behavior: None Family Suicide History: No Recent stressful life event(s): Other (Comment) (homeless, susbtance addiction) Persecutory voices/beliefs?: No Depression: No Depression Symptoms: Fatigue;Feeling worthless/self pity Substance abuse history and/or treatment for substance abuse?: Yes Suicide prevention information given to non-admitted patients: Not applicable  Risk to Others Homicidal Ideation: No Thoughts of Harm to Others: No Current Homicidal Intent: No Current Homicidal  Plan: No Access to Homicidal Means: No Identified Victim: na History of harm to others?: No Assessment of Violence: None Noted Violent Behavior Description:  Cooperative,Calm,fatigue Does patient have access to weapons?: No Criminal Charges Pending?: Yes Describe Pending Criminal Charges: driving while license revoked Does patient have a court date: Yes Court Date:  (unknown by patient)  Psychosis Hallucinations: None noted Delusions: None noted  Mental Status Report Appear/Hygiene: Other (Comment) (Appropriate) Eye Contact: Poor Motor Activity: Restlessness Speech: Logical/coherent Level of Consciousness: Alert Mood: Other (Comment) (sleepy,lethargic) Affect: Appropriate to circumstance (Euthymic) Anxiety Level: Minimal Thought Processes: Coherent;Relevant;Circumstantial Judgement: Unimpaired Orientation: Person;Place;Time;Situation Obsessive Compulsive Thoughts/Behaviors: None  Cognitive Functioning Concentration: Decreased Memory: Recent Intact;Remote Intact IQ: Average Insight: Poor Impulse Control: Poor Appetite: Fair Weight Loss: 0 Weight Gain: 0 Sleep: Decreased Total Hours of Sleep: 3 Vegetative Symptoms: None  ADLScreening [facility] Assessment Services) Patient's cognitive ability adequate to safely complete daily activities?: Yes Patient able to express need for assistance with ADLs?: Yes Independently performs ADLs?: Yes (appropriate for developmental age)  Prior Inpatient Therapy Prior Inpatient Therapy: Yes Prior Therapy Dates: 2014 Prior Therapy Facilty/Provider(s): [facility] Reason for Treatment: Detox  Prior Outpatient Therapy Prior Outpatient Therapy: No Prior Therapy Dates: na Prior Therapy Facilty/Provider(s): na Reason for Treatment: na  ADL Screening (condition at time of admission) Patient's cognitive ability adequate to safely complete daily activities?: Yes Patient able to express need for assistance with ADLs?: Yes Independently performs ADLs?: Yes (appropriate for developmental age)  Home Assistive Devices/Equipment Home Assistive Devices/Equipment: None      Values / Beliefs Cultural  Requests During Hospitalization: None Spiritual Requests During Hospitalization: None        Additional Information 1:1 In Past 12 Months?: No CIRT Risk: No Elopement Risk: No Does patient have medical clearance?: Yes     Disposition:  Disposition Initial Assessment Completed for this Encounter: Yes Disposition of Patient: Other dispositions (Pt to be evaluated by psychiatrist to determine further disp) Other disposition(s): Other (Comment)  Garek Schuneman, Agustina Caroli, MS, LCASA Assessment Counselor  02/20/2013 11:41 PM

## 2013-02-20 NOTE — [facility] Group Notes (Signed)
[facility] LCSW Aftercare Discharge Planning Group Note   02/20/2013 10:31 AM  Participation Quality:  Appropriate   Mood/Affect:  Appropriate  Depression Rating:  0  Anxiety Rating:  3  Thoughts of Suicide:  No Will you contract for safety?   NA  Current AVH:  No  Plan for Discharge/Comments:  Pt will go to [facility] in [place]. He plans to see his PCP in [place] and does not want referral for therapy. He plans to continue NA with sponsor.   Transportation Means: bus pass/part bus $   Supports: friends from [place] [facility]/limited family supports   Proofreader, Research officer, trade union

## 2013-02-20 NOTE — ED Provider Notes (Signed)
CSN: [id number]     Arrival date & time 02/20/13  1854 History  This chart was scribed for non-physician practitioner Clayton Bibles, PA-C working with Babette Relic, MD by Eston Mould, ED Scribe. This patient was seen in room WLCON/WLCON and the patient's care was started at 7:13 PM .   Chief Complaint  Patient presents with  . Medical Clearance   HPI HPI Comments: Zachary Madden is a 28 y.o. male who presents to the Emergency Department complaining of detox from Benzos. Pt states he just left [facility] and states he has rx for klonopin due to his anxiety. Pt states the [facility] has informed him they were not comfortable with pt being in the home with klonopin. Pt reports wanting to be dotox from klonopin and all medications. He states he has several friends at the house and but states he has "another home he will be looking into to stay at soon". Pt states he is "otherwise healthy and fine".  Pt states he needs Orajel due to having lower dental pain. He states he has a hx of abscess/infection and states "he does not suspect he has an infection at this time".  States it does not feel like prior infection/abscess.  Pain began 6 hours ago and is constant. Pt denies having fever, sore throat, swelling of face, CP, emesis , and diarrhea.   Pt was seen 1/ /2015 at [facility] ED. Pt is suppose to go to [facility] [address]. Pt states he has an apt 02/27/2013 for medication management. Pt was being admitted for Benzos and Narcotic abuse essentially.    Past Medical History  Diagnosis Date  . Allergy   . Anxiety   . Cholelithiasis    No past surgical history on file. Family History  Problem Relation Age of Onset  . Anxiety disorder Mother   . Chronic fatigue Mother   . Heart disease Father   . Diabetes Father    History  Substance Use Topics  . Smoking status: Former Research scientist (life sciences)  . Smokeless tobacco: Not on file  . Alcohol Use: No     Comment: Patient denies    Review of  Systems  Constitutional: Negative for fever and chills.  HENT: Positive for dental problem. Negative for facial swelling and sore throat.   Respiratory: Negative for cough and shortness of breath.   Cardiovascular: Negative for chest pain.  Gastrointestinal: Negative for vomiting, abdominal pain and diarrhea.    Allergies  Review of patient's allergies indicates no known allergies.  Home Medications   Current Outpatient Rx  Name  Route  Sig  Dispense  Refill  . Multiple Vitamin (MULTIVITAMIN WITH MINERALS) TABS tablet   Oral   Take 1 tablet by mouth daily. For low vitamin         . traZODone (DESYREL) 100 MG tablet   Oral   Take 1 tablet (100 mg total) by mouth at bedtime as needed and may repeat dose one time if needed for sleep.   60 tablet   0    BP 116/87  Pulse 73  Temp(Src) 97.6 F (36.4 C) (Oral)  Resp 20  Ht 5\' 6"  (1.676 m)  Wt 172 lb (78.019 kg)  BMI 27.77 kg/m2  SpO2 98%  Physical Exam  Nursing note and vitals reviewed. Constitutional: He appears well-developed and well-nourished. No distress.  HENT:  Head: Normocephalic and atraumatic.  Mouth/Throat: Uvula is midline and oropharynx is clear and moist. Mucous membranes are not  dry. No uvula swelling. No oropharyngeal exudate, posterior oropharyngeal edema, posterior oropharyngeal erythema or tonsillar abscesses.    Dental decay  Neck: Neck supple.  Cardiovascular: Normal rate and regular rhythm.   Pulmonary/Chest: Effort normal and breath sounds normal. No respiratory distress. He has no wheezes. He has no rales.  Abdominal: Soft. He exhibits no distension and no mass. There is no tenderness. There is no rebound and no guarding.  Neurological: He is alert. He exhibits normal muscle tone.  Skin: He is not diaphoretic.    ED Course  Procedures  DIAGNOSTIC STUDIES: Oxygen Saturation is 98% on RA, normal by my interpretation.    COORDINATION OF CARE: 7:18 PM-Discussed treatment plan which includes  labs. Pt agreed to plan.   Labs Review Labs Reviewed - No data to display Imaging Review No results found.  EKG Interpretation   None      MDM   1. Benzodiazepine dependence   2. Pain, dental    Pt with addiction to benzodiazepines requesting detox and placement in rehab house.  Also with new dental pain x 6 hours.   Afebrile, nontoxic patient with new dental pain.  No obvious abscess.  No concerning findings on exam.  Doubt deep space head or neck infection.  Doubt Ludwig's angina.     Dr Hillard Danker made aware of patient at end of my shift.  Pending medical clearance and psych/ACT assessment.   I personally performed the services described in this documentation, which was scribed in my presence. The recorded information has been reviewed and is accurate.    Clayton Bibles, PA-C 02/20/13 2004

## 2013-02-21 ENCOUNTER — Encounter (HOSPITAL_COMMUNITY): Payer: Self-pay | Admitting: Registered Nurse

## 2013-02-21 NOTE — ED Provider Notes (Signed)
Medical screening examination/treatment/procedure(s) were performed by non-physician practitioner and as supervising physician I was immediately available for consultation/collaboration.  EKG Interpretation   None      Pt sleeping; TTS reports Pt to stay in ED overnight to see psychiatrist in AM.  Babette Relic, MD 02/21/13 [phone]

## 2013-02-21 NOTE — Discharge Instructions (Signed)
Finding Treatment for Alcohol and Drug Addiction  It can be hard to find the right place to get professional treatment. Here are some important things to consider:   There are different types of treatment to choose from.   Some programs are live-in (residential) while others are not (outpatient). Sometimes a combination is offered.   No single type of program is right for everyone.   Most treatment programs involve a combination of education, counseling, and a 12-step, spiritually-based approach.   There are non-spiritually based programs (not 12-step).   Some treatment programs are government sponsored. They are geared for patients without private insurance.   Treatment programs can vary in many respects such as:   Cost and types of insurance accepted.   Types of on-site medical services offered.   Length of stay, setting, and size.   Overall philosophy of treatment.  A person may need specialized treatment or have needs not addressed by all programs. For example, adolescents need treatment appropriate for their age. Other people have secondary disorders that must be managed as well. Secondary conditions can include mental illness, such as depression or diabetes. Often, a period of detoxification from alcohol or drugs is needed. This requires medical supervision and not all programs offer this.  THINGS TO CONSIDER WHEN SELECTING A TREATMENT PROGRAM    Is the program certified by the appropriate government agency? Even private programs must be certified and employ certified professionals.   Does the program accept your insurance? If not, can a payment plan be set up?   Is the facility clean, organized, and well run? Do they allow you to speak with graduates who can share their treatment experience with you? Can you tour the facility? Can you meet with staff?   Does the program meet the full range of individual needs?   Does the treatment program address sexual orientation and physical disabilities?  Do they provide age, gender, and culturally appropriate treatment services?   Is treatment available in languages other than English?   Is long-term aftercare support or guidance encouraged and provided?   Is assessment of an individual's treatment plan ongoing to ensure it meets changing needs?   Does the program use strategies to encourage reluctant patients to remain in treatment long enough to increase the likelihood of success?   Does the program offer counseling (individual or group) and other behavioral therapies?   Does the program offer medicine as part of the treatment regimen, if needed?   Is there ongoing monitoring of possible relapse? Is there a defined relapse prevention program? Are services or referrals offered to family members to ensure they understand addiction and the recovery process? This would help them support the recovering individual.   Are 12-step meetings held at the center or is transport available for patients to attend outside meetings?  In countries outside of the [place], see local directories for contact information for services in your area.  Document Released: 12/04/2004 Document Revised: 03/30/2011 Document Reviewed: 06/16/2007  ExitCare Patient Information 2014 [employer].

## 2013-02-21 NOTE — Consult Note (Signed)
Patient seen and examined by me. Discussed in length with patient the need for him to go to [facility] as patient states he was excepted there. Discussed with patient that he did not have benzodiazepines on him and patient states that he does not have any. Patient denies any suicidal thoughts, any symptoms of depression, any homicidal ideation

## 2013-02-21 NOTE — Progress Notes (Signed)
Per discussion with psychiatrist and NP, patient is psychiatrically and medically stable for dc. Patient plans to follow up with [facility]. CSW attempted to reach [place] contacts patient provided however unable to reach.   Noreene Larsson [phone]  ED CSW 02/21/2013 1129am

## 2013-02-21 NOTE — Consult Note (Signed)
[facility] Consult   Reason for Consult:  Benzo Detox Referring Physician:  EDP  Zachary Madden is an 28 y.o. male. Total Time spent with patient: 45 minutes  Assessment: AXIS I:  Substance Abuse AXIS II:  Deferred AXIS III:   Past Medical History  Diagnosis Date  . Allergy   . Anxiety   . Cholelithiasis   . Mental disorder    AXIS IV:  other psychosocial or environmental problems AXIS V:  61-70 mild symptoms  Plan:  No evidence of imminent risk to self or others at present.   Patient does not meet criteria for psychiatric inpatient admission. Supportive therapy provided about ongoing stressors. Discussed crisis plan, support from social network, calling 911, coming to the Emergency Department, and calling Suicide Hotline.  Subjective:   Zachary Madden is a 28 y.o. male patient.  HPI:  Patient states that he was released for [facility] yesterday.  "I got out of [facility]al [facility] yesterday after detox from alcohol.  I went to the [facility] and I tested positive for Klonopin; They told me to come back and get detox again for the Klonopin.  I took three Klonopin yesterday.  It was some that I already had.  I didn't know I couldn't take them.  I thought since they were prescribed it would be okay; but the guy told me I couldn't be on anything and couldn't take any.  So I need to detox again but for the klonopin this time.  No I am not suicidal."  Patient also denies homicidal ideations, psychosis, and paranoia.   Discussed with patient that when he was detox from the alcohol he was also detox for Benzodiazepines and would not need to go back through detox again for taking only 3 klonopin.  Informed patient Klonopin was not one of the medications that he was sent home with at discharge from [facility].    HPI Elements:   Location:  Detox Benzo. Quality:  Patient took 3 kolonopin tablets and wants detox. Severity:  Patient took 3 kolonopin tablets and wants  detox. Timing:  1 day.  Past Psychiatric History: Past Medical History  Diagnosis Date  . Allergy   . Anxiety   . Cholelithiasis   . Mental disorder     reports that he has quit smoking. His smokeless tobacco use includes Snuff. He reports that he uses illicit drugs (Benzodiazepines). He reports that he does not drink alcohol. Family History  Problem Relation Age of Onset  . Anxiety disorder Mother   . Chronic fatigue Mother   . Heart disease Father   . Diabetes Father    Family History Substance Abuse: No Family Supports: No Living Arrangements: Other (Comment) (homeless) Can pt return to current living arrangement?: Yes Abuse/Neglect [facility]) Physical Abuse: Denies Verbal Abuse: Denies Sexual Abuse: Denies Allergies:  No Known Allergies  ACT Assessment Complete:  Yes:    Educational Status    Risk to Self: Risk to self Suicidal Ideation: No Suicidal Intent: No Is patient at risk for suicide?: No Suicidal Plan?: No Access to Means: No What has been your use of drugs/alcohol within the last 12 months?: Prior hx of Heroin,THC, noted benzo's prescribed but pt requesting to be detoxed from Benzo's Previous Attempts/Gestures: No How many times?: 0 Other Self Harm Risks: none reported Triggers for Past Attempts: None known Intentional Self Injurious Behavior: None Family Suicide History: No Recent stressful life event(s): Other (Comment) (homeless, susbtance addiction) Persecutory voices/beliefs?: No  Depression: No Depression Symptoms: Fatigue;Feeling worthless/self pity Substance abuse history and/or treatment for substance abuse?: Yes Suicide prevention information given to non-admitted patients: Not applicable  Risk to Others: Risk to Others Homicidal Ideation: No Thoughts of Harm to Others: No Current Homicidal Intent: No Current Homicidal Plan: No Access to Homicidal Means: No Identified Victim: na History of harm to others?: No Assessment of Violence: None  Noted Violent Behavior Description: Cooperative,Calm,fatigue Does patient have access to weapons?: No Criminal Charges Pending?: Yes Describe Pending Criminal Charges: driving while license revoked Does patient have a court date: Yes Court Date:  (unknown by patient)  Abuse: Abuse/Neglect Assessment (Assessment to be complete while patient is alone) Physical Abuse: Denies Verbal Abuse: Denies Sexual Abuse: Denies Exploitation of patient/patient's resources: Denies Self-Neglect: Denies  Prior Inpatient Therapy: Prior Inpatient Therapy Prior Inpatient Therapy: Yes Prior Therapy Dates: 2014 Prior Therapy Facilty/Provider(s): [facility] Reason for Treatment: Detox  Prior Outpatient Therapy: Prior Outpatient Therapy Prior Outpatient Therapy: No Prior Therapy Dates: na Prior Therapy Facilty/Provider(s): na Reason for Treatment: na  Additional Information: Additional Information 1:1 In Past 12 Months?: No CIRT Risk: No Elopement Risk: No Does patient have medical clearance?: Yes                  Objective: Blood pressure 108/73, pulse 90, temperature 97.7 F (36.5 C), temperature source Oral, resp. rate 18, height _0  (1.676 m), weight 78.019 kg (172 lb), SpO2 98.00%.Body mass index is 27.77 kg/(m^2). Results for orders placed during the hospital encounter of 02/20/13 (from the past 72 hour(s))  URINE RAPID DRUG SCREEN (HOSP PERFORMED)     Status: Abnormal   Collection Time    02/20/13  7:21 PM      Result Value Range   Opiates NONE DETECTED  NONE DETECTED   Cocaine NONE DETECTED  NONE DETECTED   Benzodiazepines POSITIVE (*) NONE DETECTED   Amphetamines NONE DETECTED  NONE DETECTED   Tetrahydrocannabinol NONE DETECTED  NONE DETECTED   Barbiturates NONE DETECTED  NONE DETECTED   Comment:            DRUG SCREEN FOR MEDICAL PURPOSES     ONLY.  IF CONFIRMATION IS NEEDED     FOR ANY PURPOSE, NOTIFY LAB     WITHIN 5 DAYS.                LOWEST DETECTABLE LIMITS      FOR URINE DRUG SCREEN     Drug Class       Cutoff (ng/mL)     Amphetamine      1000     Barbiturate      200     Benzodiazepine   150     Tricyclics       569     Opiates          300     Cocaine          300     THC              50  CBC WITH DIFFERENTIAL     Status: Abnormal   Collection Time    02/20/13  7:50 PM      Result Value Range   WBC 7.0  4.0 - 10.5 K/uL   RBC 4.21 (*) 4.22 - 5.81 MIL/uL   Hemoglobin 13.4  13.0 - 17.0 g/dL   HCT 37.8 (*) 39.0 - 52.0 %   MCV 89.8  78.0 - 100.0 fL  MCH 31.8  26.0 - 34.0 pg   MCHC 35.4  30.0 - 36.0 g/dL   RDW 12.4  11.5 - 15.5 %   Platelets 275  150 - 400 K/uL   Neutrophils Relative % 56  43 - 77 %   Neutro Abs 3.9  1.7 - 7.7 K/uL   Lymphocytes Relative 34  12 - 46 %   Lymphs Abs 2.3  0.7 - 4.0 K/uL   Monocytes Relative 7  3 - 12 %   Monocytes Absolute 0.5  0.1 - 1.0 K/uL   Eosinophils Relative 3  0 - 5 %   Eosinophils Absolute 0.2  0.0 - 0.7 K/uL   Basophils Relative 0  0 - 1 %   Basophils Absolute 0.0  0.0 - 0.1 K/uL  COMPREHENSIVE METABOLIC PANEL     Status: Abnormal   Collection Time    02/20/13  7:50 PM      Result Value Range   Sodium 136 (*) 137 - 147 mEq/L   Potassium 4.1  3.7 - 5.3 mEq/L   Chloride 97  96 - 112 mEq/L   CO2 29  19 - 32 mEq/L   Glucose, Bld 95  70 - 99 mg/dL   BUN 14  6 - 23 mg/dL   Creatinine, Ser 0.80  0.50 - 1.35 mg/dL   Calcium 9.2  8.4 - 10.5 mg/dL   Total Protein 7.1  6.0 - 8.3 g/dL   Albumin 3.7  3.5 - 5.2 g/dL   AST 23  0 - 37 U/L   ALT 22  0 - 53 U/L   Alkaline Phosphatase 101  39 - 117 U/L   Total Bilirubin <0.2 (*) 0.3 - 1.2 mg/dL   GFR calc non Af Amer >90  >90 mL/min   GFR calc Af Amer >90  >90 mL/min   Comment: (NOTE)     The eGFR has been calculated using the CKD EPI equation.     This calculation has not been validated in all clinical situations.     eGFR's persistently <90 mL/min signify possible Chronic Kidney     Disease.  ETHANOL     Status: None   Collection Time     02/20/13  7:50 PM      Result Value Range   Alcohol, Ethyl (B) <11  0 - 11 mg/dL   Comment:            LOWEST DETECTABLE LIMIT FOR     SERUM ALCOHOL IS 11 mg/dL     FOR MEDICAL PURPOSES ONLY   Labs are reviewed and are pertinent for alcohol and illicit drug use.  Other medical conditions.  Current Facility-Administered Medications  Medication Dose Route Frequency Provider Last Rate Last Dose  . acetaminophen (TYLENOL) tablet 650 mg  650 mg Oral Q4H PRN Clayton Bibles, PA-C      . alum & mag hydroxide-simeth (MAALOX/MYLANTA) 200-200-20 MG/5ML suspension 30 mL  30 mL Oral PRN Clayton Bibles, PA-C      . benzocaine (ORAJEL) 10 % mucosal gel   Mouth/Throat QID PRN Clayton Bibles, PA-C      . ibuprofen (ADVIL,MOTRIN) tablet 600 mg  600 mg Oral Q8H PRN Clayton Bibles, PA-C   600 mg at 02/21/13 1140  . LORazepam (ATIVAN) tablet 0-4 mg  0-4 mg Oral Q6H Clayton Bibles, PA-C       Followed by  . [START ON 02/22/2013] LORazepam (ATIVAN) tablet 0-4 mg  0-4 mg Oral Q12H Clayton Bibles, PA-C      .  multivitamin with minerals tablet 1 tablet  1 tablet Oral Daily Clayton Bibles, PA-C   1 tablet at 02/21/13 0919  . nicotine (NICODERM CQ - dosed in mg/24 hours) patch 21 mg  21 mg Transdermal Daily Clayton Bibles, PA-C   21 mg at 02/20/13 2054  . ondansetron (ZOFRAN) tablet 4 mg  4 mg Oral Q8H PRN Clayton Bibles, PA-C      . penicillin v potassium (VEETID) tablet 500 mg  500 mg Oral Q6H Emily West, PA-C   500 mg at 02/21/13 0609  . traZODone (DESYREL) tablet 100 mg  100 mg Oral QHS PRN,MR X 1 Emily West, PA-C      . zolpidem (AMBIEN) tablet 5 mg  5 mg Oral QHS PRN Clayton Bibles, PA-C       Current Outpatient Prescriptions  Medication Sig Dispense Refill  . clonazePAM (KLONOPIN) 1 MG tablet Take 1 mg by mouth 2 (two) times daily.      . Multiple Vitamin (MULTIVITAMIN WITH MINERALS) TABS tablet Take 1 tablet by mouth daily. For low vitamin      . traZODone (DESYREL) 100 MG tablet Take 1 tablet (100 mg total) by mouth at bedtime as needed and may  repeat dose one time if needed for sleep.  60 tablet  0    Psychiatric Specialty Exam:     Blood pressure 108/73, pulse 90, temperature 97.7 F (36.5 C), temperature source Oral, resp. rate 18, height _0  (1.676 m), weight 78.019 kg (172 lb), SpO2 98.00%.Body mass index is 27.77 kg/(m^2).  General Appearance: Casual  Eye Contact::  Good  Speech:  Blocked and Normal Rate  Volume:  Normal  Mood:  "I'm okay; just need detox again"  Affect:  Congruent  Thought Process:  Circumstantial and Goal Directed  Orientation:  Full (Time, Place, and Person)  Thought Content:  Rumination  Suicidal Thoughts:  No  Homicidal Thoughts:  No  Memory:  Immediate;   Good Recent;   Good  Judgement:  Good  Insight:  Good  Psychomotor Activity:  Normal  Concentration:  Fair  Recall:  Good  Fund of Knowledge:Good  Language: Good  Akathisia:  No  Handed:  Right  AIMS (if indicated):     Assets:  Communication Skills Desire for Improvement  Sleep:      Musculoskeletal: Strength & Muscle Tone: within normal limits Gait & Station: normal Patient leans: N/A  Treatment Plan Summary: Resources for rehab facilities   Discussed with SW patient wanting to get into [facility].  Several attempts made by SW to [facility].  Patient states that he was accepted to [facility].  Disposition:  Discharge home patient will continue to follow up with [facility].  Patient given resources for rehab facilities.   Discharge Assessment     Demographic Factors:  Male and Caucasian  Total Time spent with patient: 15 minutes  Psychiatric Specialty Exam: Mental Status Per Nursing Assessment::   On Admission:     Current Mental Status by Physician: Wants assistance with rehab facility  Loss Factors: NA  Historical Factors: NA  Risk Reduction Factors:   Positive coping skills or problem solving skills  Continued Clinical Symptoms:  Alcohol/Substance Abuse/Dependencies  Cognitive Features That  Contribute To Risk:  Thought constriction (tunnel vision)    Suicide Risk:  Minimal: No identifiable suicidal ideation.  Patients presenting with no risk factors but with morbid ruminations; may be classified as minimal risk based on the severity of the depressive symptoms  Discharge Diagnoses:   AXIS I:  Substance Abuse AXIS II:  Deferred AXIS III:   Past Medical History  Diagnosis Date  . Allergy   . Anxiety   . Cholelithiasis   . Mental disorder    AXIS IV:  other psychosocial or environmental problems and problems related to social environment AXIS V:  61-70 mild symptoms  Plan Of Care/Follow-up recommendations:  Activity:  Resume usual activity Diet:  Resume usual diet  Is patient on multiple antipsychotic therapies at discharge:  No   Has Patient had three or more failed trials of antipsychotic monotherapy by history:  No  Recommended Plan for Multiple Antipsychotic Therapies: NA  Rankin, Shuvon FNP-BC 02/21/2013 11:49 AM

## 2013-02-23 NOTE — Progress Notes (Signed)
Patient Discharge Instructions:  Next Level Care Provider Has Access to the EMR, 02/23/13 Records provided to [facility] via [facility]/Epic access.  Zachary Madden, 02/23/2013, 3:42 PM

## 2013-02-27 ENCOUNTER — Ambulatory Visit: Payer: Self-pay | Admitting: Family Medicine

## 2013-07-27 ENCOUNTER — Emergency Department: Payer: Self-pay | Admitting: Emergency Medicine

## 2013-12-11 ENCOUNTER — Emergency Department: Payer: Self-pay | Admitting: Emergency Medicine

## 2013-12-11 LAB — CBC WITH DIFFERENTIAL/PLATELET
BASOS PCT: 0.3 %
Basophil #: 0 10*3/uL (ref 0.0–0.1)
EOS PCT: 0.8 %
Eosinophil #: 0 10*3/uL (ref 0.0–0.7)
HCT: 45.1 % (ref 40.0–52.0)
HGB: 15.2 g/dL (ref 13.0–18.0)
LYMPHS ABS: 1 10*3/uL (ref 1.0–3.6)
LYMPHS PCT: 18.3 %
MCH: 31.4 pg (ref 26.0–34.0)
MCHC: 33.8 g/dL (ref 32.0–36.0)
MCV: 93 fL (ref 80–100)
MONO ABS: 0.5 x10 3/mm (ref 0.2–1.0)
Monocyte %: 9.3 %
Neutrophil #: 4 10*3/uL (ref 1.4–6.5)
Neutrophil %: 71.3 %
Platelet: 260 10*3/uL (ref 150–440)
RBC: 4.85 10*6/uL (ref 4.40–5.90)
RDW: 12.8 % (ref 11.5–14.5)
WBC: 5.6 10*3/uL (ref 3.8–10.6)

## 2013-12-11 LAB — COMPREHENSIVE METABOLIC PANEL
ALBUMIN: 3.4 g/dL (ref 3.4–5.0)
AST: 27 U/L (ref 15–37)
Alkaline Phosphatase: 105 U/L
Anion Gap: 7 (ref 7–16)
BILIRUBIN TOTAL: 0.2 mg/dL (ref 0.2–1.0)
BUN: 12 mg/dL (ref 7–18)
Calcium, Total: 8 mg/dL — ABNORMAL LOW (ref 8.5–10.1)
Chloride: 111 mmol/L — ABNORMAL HIGH (ref 98–107)
Co2: 24 mmol/L (ref 21–32)
Creatinine: 0.95 mg/dL (ref 0.60–1.30)
EGFR (African American): >60
Glucose: 90 mg/dL (ref 65–99)
Osmolality: 282 (ref 275–301)
Potassium: 3.6 mmol/L (ref 3.5–5.1)
SGPT (ALT): 30 U/L
Sodium: 142 mmol/L (ref 136–145)
Total Protein: 7 g/dL (ref 6.4–8.2)

## 2013-12-11 LAB — URINALYSIS, COMPLETE
BLOOD: NEGATIVE
Bacteria: NONE SEEN
Bilirubin,UR: NEGATIVE
Glucose,UR: NEGATIVE mg/dL (ref 0–75)
Ketone: NEGATIVE
Leukocyte Esterase: NEGATIVE
Nitrite: NEGATIVE
Ph: 5 (ref 4.5–8.0)
Protein: NEGATIVE
RBC,UR: 1 /HPF (ref 0–5)
Specific Gravity: 1.021 (ref 1.003–1.030)
Squamous Epithelial: NONE SEEN
WBC UR: 2 /HPF (ref 0–5)

## 2013-12-25 ENCOUNTER — Inpatient Hospital Stay: Payer: Self-pay | Admitting: Internal Medicine

## 2013-12-25 LAB — BASIC METABOLIC PANEL WITH GFR
Anion Gap: 9
BUN: 11 mg/dL
Calcium, Total: 8.4 mg/dL — ABNORMAL LOW
Chloride: 104 mmol/L
Co2: 27 mmol/L
Creatinine: 1.02 mg/dL
EGFR (African American): >60
EGFR (Non-African Amer.): >60
Glucose: 106 mg/dL — ABNORMAL HIGH
Osmolality: 279
Potassium: 3.9 mmol/L
Sodium: 140 mmol/L

## 2013-12-25 LAB — CBC WITH DIFFERENTIAL/PLATELET
Basophil #: 0 x10 3/mm 3
Basophil %: 0.3 %
Eosinophil #: 0 x10 3/mm 3
Eosinophil %: 0.4 %
HCT: 44.5 %
HGB: 15.1 g/dL
Lymphocyte %: 10.8 %
Lymphs Abs: 1.3 x10 3/mm 3
MCH: 31.1 pg
MCHC: 33.9 g/dL
MCV: 92 fL
Monocyte #: 1 "x10 3/mm "
Monocyte %: 8.8 %
Neutrophil #: 9.3 x10 3/mm 3 — ABNORMAL HIGH
Neutrophil %: 79.7 %
Platelet: 299 x10 3/mm 3
RBC: 4.85 x10 6/mm 3
RDW: 13.2 %
WBC: 11.7 x10 3/mm 3 — ABNORMAL HIGH

## 2013-12-26 LAB — CBC WITH DIFFERENTIAL/PLATELET
BASOS PCT: 0.1 %
Basophil #: 0 10*3/uL (ref 0.0–0.1)
Eosinophil #: 0 10*3/uL (ref 0.0–0.7)
Eosinophil %: 0.1 %
HCT: 41 % (ref 40.0–52.0)
HGB: 13.7 g/dL (ref 13.0–18.0)
LYMPHS ABS: 1.7 10*3/uL (ref 1.0–3.6)
LYMPHS PCT: 13.2 %
MCH: 30.9 pg (ref 26.0–34.0)
MCHC: 33.4 g/dL (ref 32.0–36.0)
MCV: 92 fL (ref 80–100)
MONO ABS: 1.3 x10 3/mm — AB (ref 0.2–1.0)
Monocyte %: 9.9 %
NEUTROS ABS: 9.9 10*3/uL — AB (ref 1.4–6.5)
Neutrophil %: 76.7 %
Platelet: 285 10*3/uL (ref 150–440)
RBC: 4.44 10*6/uL (ref 4.40–5.90)
RDW: 13.3 % (ref 11.5–14.5)
WBC: 12.9 10*3/uL — ABNORMAL HIGH (ref 3.8–10.6)

## 2014-02-10 ENCOUNTER — Emergency Department: Payer: Self-pay | Admitting: Emergency Medicine

## 2014-02-10 LAB — SALICYLATE LEVEL: Salicylates, Serum: <1.7 mg/dL

## 2014-02-10 LAB — DRUG SCREEN, URINE
AMPHETAMINES, UR SCREEN: NEGATIVE (ref ?–1000)
Barbiturates, Ur Screen: NEGATIVE (ref ?–200)
Benzodiazepine, Ur Scrn: POSITIVE (ref ?–200)
Cannabinoid 50 Ng, Ur ~~LOC~~: POSITIVE (ref ?–50)
Cocaine Metabolite,Ur ~~LOC~~: POSITIVE (ref ?–300)
MDMA (Ecstasy)Ur Screen: NEGATIVE (ref ?–500)
Methadone, Ur Screen: NEGATIVE (ref ?–300)
OPIATE, UR SCREEN: NEGATIVE (ref ?–300)
PHENCYCLIDINE (PCP) UR S: NEGATIVE (ref ?–25)
Tricyclic, Ur Screen: NEGATIVE (ref ?–1000)

## 2014-02-10 LAB — CBC
HCT: 48.3 % (ref 40.0–52.0)
HGB: 15.9 g/dL (ref 13.0–18.0)
MCH: 31 pg (ref 26.0–34.0)
MCHC: 32.9 g/dL (ref 32.0–36.0)
MCV: 94 fL (ref 80–100)
Platelet: 373 10*3/uL (ref 150–440)
RBC: 5.12 10*6/uL (ref 4.40–5.90)
RDW: 13.4 % (ref 11.5–14.5)
WBC: 7.4 10*3/uL (ref 3.8–10.6)

## 2014-02-10 LAB — URINALYSIS, COMPLETE
Bacteria: NONE SEEN
Bilirubin,UR: NEGATIVE
Blood: NEGATIVE
Glucose,UR: NEGATIVE mg/dL (ref 0–75)
Leukocyte Esterase: NEGATIVE
NITRITE: NEGATIVE
PROTEIN: NEGATIVE
Ph: 6 (ref 4.5–8.0)
Specific Gravity: 1.017 (ref 1.003–1.030)
Squamous Epithelial: NONE SEEN
WBC UR: 1 /HPF (ref 0–5)

## 2014-02-10 LAB — COMPREHENSIVE METABOLIC PANEL
ALBUMIN: 4 g/dL (ref 3.4–5.0)
ANION GAP: 9 (ref 7–16)
AST: 28 U/L (ref 15–37)
Alkaline Phosphatase: 95 U/L
BUN: 9 mg/dL (ref 7–18)
Bilirubin,Total: 0.4 mg/dL (ref 0.2–1.0)
CREATININE: 1 mg/dL (ref 0.60–1.30)
Calcium, Total: 9.1 mg/dL (ref 8.5–10.1)
Chloride: 107 mmol/L (ref 98–107)
Co2: 24 mmol/L (ref 21–32)
EGFR (African American): >60
EGFR (Non-African Amer.): >60
GLUCOSE: 124 mg/dL — AB (ref 65–99)
Osmolality: 280 (ref 275–301)
POTASSIUM: 3.7 mmol/L (ref 3.5–5.1)
SGPT (ALT): 33 U/L
Sodium: 140 mmol/L (ref 136–145)
TOTAL PROTEIN: 8 g/dL (ref 6.4–8.2)

## 2014-02-10 LAB — ETHANOL

## 2014-02-10 LAB — ACETAMINOPHEN LEVEL: Acetaminophen: <2 ug/mL

## 2014-05-12 NOTE — Consult Note (Signed)
Brief Consult Note: Diagnosis: Dental infection.   Patient was seen by consultant.   Comments: Patient with history of right mandibular dental infection.  CT revealed cellulitic change.  Reports significant pain and difficulty with secretions, but reports wants liquid to drink as well.  Known bad tooth in right mandible.  CT- cellulitis of right mandible, no significant extension to neck  PE GEN- laying supine in bed resting, mild distress with palpation of area OC/OP- tenderness and widening of mandible, soft floor of mouth with tenderness, no fluctuance Neck- shotty LAD, no induration or masses  Impression: 1)  Will re-evaluate this p.m. 2)  Agree with Zosyn and then transition to Clindamycin vs Augmentin orally when responds 3)  Decadron given in ED, can give Sterapred DS 6 day taper.  Electronic Signatures: Kutter Schnepf, Shela Leff (MD)  (Signed 07-Dec-15 16:53)  Authored: Brief Consult Note   Last Updated: 07-Dec-15 16:53 by Pascal Lux (MD)

## 2014-05-12 NOTE — H&P (Signed)
PATIENT NAME:  Zachary Madden, Zachary Madden MR#:  [id number] DATE OF BIRTH:  December 30, 1985  DATE OF ADMISSION:  12/25/2013  PRIMARY CARE PHYSICIAN: None.   CHIEF COMPLAINT: Neck and oral pain with dysphagia.   HISTORY OF PRESENTING ILLNESS:  A 29 year old Caucasian male patient with no past medical history presents to the Emergency Room complaining of 2 days of pain in his mouth along with the right side of the neck, jaw, and some dysphagia.  The patient does not have any shortness of breath. No fever or chills. He was hoping this would get better, but with worsening pain has presented to the Emergency Room.   A CT scan of the neck has shown phlegmonous changes. The medial aspect of right third molar suggestive of early abscess along with cellulitis around it.   PAST MEDICAL HISTORY:  None.   FAMILY HISTORY: Hypertension and diabetes in the family.   SOCIAL HISTORY: The patient smokes occasionally.  No alcohol. No illicit drugs. Works in Engineer, technical sales.   CODE STATUS:  FULL CODE.  REVIEW OF SYSTEMS:   CONSTITUTIONAL: Complains of fatigue and weakness.  EYES: No blurred vision, pain or redness.  ENT: No tinnitus, ear pain, hearing loss. RESPIRATORY: No cough, wheeze, hemoptysis.  CARDIOVASCULAR: No chest pain, orthopnea, edema.  GASTROINTESTINAL: No nausea, vomiting, there abdominal pain.  GENITOURINARY:  No dysuria, hematuria or frequency.   ENDOCRINE: No polyuria, nocturia, thyroid problems.  HEMATOLOGIC AND LYMPHATIC: No anemia, easy bruising, bleeding.   INTEGUMENTARY: No rash or lesion.  MUSCULOSKELETAL: No back pain, arthritis.  NEUROLOGIC: No focal numbness, weakness, seizure.   ALLERGIES:  No drug allergies.  HOME MEDICATIONS:  Lexapro 10 mg oral daily.    PHYSICAL EXAMINATION:  VITAL SIGNS: Temperature 97.6, pulse 68, blood pressure 129/77, saturating 97% on room air.  GENERAL: Morbidly built Caucasian male patient lying in bed in distress secondary to the pain.  PSYCHIATRIC:   Alert and oriented x 3. Mood and affect appropriate. Judgment intact.  HEENT: Atraumatic, normocephalic. Oral mucosa moist and pink. Ears and nose normal. Has swelling on the right side of the neck and jaw area, has cavities in his teeth.  No discharge found. No fluctuance found.  CARDIOVASCULAR: S1, S2, without any murmurs. Peripheral pulses 2+. No edema.  RESPIRATORY: Normal work of breathing. Clear to auscultation both sides.  GASTROINTESTINAL: Soft abdomen, nontender. Bowel sounds present. No organomegaly palpable.  SKIN: Warm and dry. No petechiae, rash, ulcers.  MUSCULOSKELETAL: No joint swelling, redness, effusion of the large joints. Normal muscle tone.  NEUROLOGICAL: Motor strength 5/5 in upper extremities.   LABORATORY STUDIES AND IMAGING STUDIES:  CT scan of the neck shows inflammatory soft tissue stranding with the right floor of the mouth and right submandibular space compatible with cellulitis and third right molar area developing abscess.  BMP shows glucose 106, BUN 9, creatinine 1.02, sodium 140, potassium 3.9, WBC 11.77, hemoglobin 15, platelets 299,000.   ASSESSMENT AND PLAN:  1.  Oral and odontogenic abscess.  At this point, the patient be started on Zosyn and also consult ENT as this seems to be extending into the neck and has dysphagia. The patient has been given a dose of Decadron in the ER. Further management as per progress and ENT recommendations.  2.  Tobacco abuse. The patient has been counseled to quit smoking for greater than 3 minutes.  3.  Deep vein thrombosis prophylaxis with sequential compression devices.   CODE STATUS: FULL CODE.   TIME SPENT THIS CASE: 43  minutes.      ____________________________ [name], MD srs:DT D: 12/25/2013 12:32:06 ET T: 12/25/2013 12:51:14 ET JOB#: [id number]  cc: Alveta Heimlich R. Juanda Luba, MD, <Dictator> Neita Carp MD ELECTRONICALLY SIGNED 01/04/2014 13:07

## 2014-05-12 NOTE — Discharge Summary (Signed)
PATIENT NAME:  Zachary Madden, Zachary Madden MR#:  [id number] DATE OF BIRTH:  01-25-1985  DISCHARGE DIAGNOSES: 1.  Right mandibular odontogenic cellulitis-abscess.  2.  Tobacco abuse.    DISCHARGE MEDICATIONS: 1.  Lexapro 10 mg daily.  2.  Amoxicillin-clavulanate 875-125 mg 1 tablet 2 times a day for 10 days.  3.  Percocet 5/325 one tablet oral 3 times a day as needed.  4.  Prednisone taper for 6 days.   DISCHARGE INSTRUCTIONS: Regular food, regular consistency. Follow up with oral surgery in 1-2 days, and quit smoking.   CONSULTS: [name], MD of ENT.   IMAGING STUDIES: CT scan of the neck showed cellulitis in the right mandibular area along with third molar abscess.   ADMITTING HISTORY AND PHYSICAL AND HOSPITAL COURSE: Please see detailed H and P dictated previously. In brief, a 29 year old male patient brought into the hospital complaining of 2 days of right-sided jaw pain, swelling, redness. The patient was found to have an abscess, cellulitis, and was admitted on antibiotics with Zosyn. The patient improved well, was seen by ENT with no dysphagia, significantly improved symptoms. The patient is being discharged home on Augmentin along with pain medications and prednisone taper. He has been referred to an oral surgeon within 1-2 days. I have advised the patient to return to the Emergency Room at the earliest if his symptoms worsen in any way.   Prior to discharge his lungs sound clear. S1, S2, heard. Mild tenderness along the right jaw area, but no swelling or fluctuance found.   TIME SPENT ON DAY OF DISCHARGE IN DISCHARGE ACTIVITY: 40 minutes.    ____________________________ Zachary Alf Jaxan Michel, MD srs:LT D: 12/27/2013 15:39:30 ET T: 12/27/2013 19:55:39 ET JOB#: [id number]  cc: Alveta Heimlich R. Raliegh Scobie, MD, <Dictator> Neita Carp MD ELECTRONICALLY SIGNED 01/04/2014 13:07

## 2014-05-12 NOTE — Consult Note (Signed)
Chief Complaint:  Subjective/Chief Complaint continues to tolerate diet.  Improved pain and swelling.  No SOB.   VITAL SIGNS/ANCILLARY NOTES: **Vital Signs.:   08-Dec-15 00:49  Vital Signs Type Q 8hr  Temperature Temperature (F) 97.8  Celsius 36.5  Temperature Source oral  Pulse Pulse 65  Respirations Respirations 18  Systolic BP Systolic BP 785  Diastolic BP (mmHg) Diastolic BP (mmHg) 67  Mean BP 83  Pulse Ox % Pulse Ox % 96  Pulse Ox Activity Level  At rest  Oxygen Delivery Room Air/ 21 %   Brief Assessment:  GEN well developed, well nourished, no acute distress   Cardiac Regular   Respiratory normal resp effort   Additional Physical Exam OC/OP- improved pain and edema, continued tenderness overlying right mandible, no fluctuance Neck- continue improved edema   Assessment/Plan:  Assessment/Plan:  Assessment Right mandibular odontogenic cellulitis significantly improved   Plan 1)  Switch to PO abx per medicine 2)  Sterapred taper 3)  Discussed with patient Oral surgery evaluation ASAP as outpatient for tooth removal   Electronic Signatures: Truitt Cruey, Shela Leff (MD)  (Signed 08-Dec-15 07:37)  Authored: Chief Complaint, VITAL SIGNS/ANCILLARY NOTES, Brief Assessment, Assessment/Plan   Last Updated: 08-Dec-15 07:37 by Pascal Lux (MD)

## 2014-05-12 NOTE — Consult Note (Signed)
PATIENT NAME:  Zachary Madden, Zachary Madden MR#:  [id number] DATE OF BIRTH:  02/25/1985  DATE OF CONSULTATION:  12/25/2013  REFERRING PHYSICIAN:   CONSULTING PHYSICIAN:  [name], MD  TIME OF CONSULTATION: 6:50 a.m.  REASON FOR CONSULTATION: Dental infection.   HISTORY OF PRESENT ILLNESS: The patient is a 29 year old male who is seen in the Emergency Room this morning complaining of several day history of mandibular pain that worsened and extending to the right side of his neck and jaw. He did have some dysphagia and difficulty tolerating liquids; however, he is able to tolerate his own secretions. He denies any shortness of breath, fever, chills. He presented to the Emergency Room and required some morphine for pain control. There was some concern for odontogenic source of the infection and he underwent a CT scan and I was asked to evaluate.   PAST MEDICAL HISTORY: None other than poor dentition.   FAMILY HISTORY: Hypertension, diabetes.  SOCIAL HISTORY: The patient smokes, denies any significant alcohol or illicit drugs; works in a Engineer, technical sales.   ALLERGIES: No known drug allergies.   CURRENT MEDICATIONS: Lexapro.   REVIEW OF SYSTEMS: Significant for fatigue, weakness, neck pain, dysphagia.   PHYSICAL EXAMINATION:  VITAL SIGNS: Temperature is 97.6, pulse 68, blood pressure 129/77. Saturations 97% room air.  GENERAL: He is well-nourished, well-developed male in no acute distress.  EARS: EACs are clear bilaterally. TMs are tight. No perforation or effusion.  NOSE: Clear anteriorly with no mucopus or polyps.  ORAL CAVITY: Oropharynx reveals some widening of his right posterior mandible around his second and third molar with a broken off tooth and some erythema. There is no induration or fluctuance. There is tenderness to palpation of the floor of mouth, but again it is very soft and supple with no woody edema.  NECK: Reveals some exquisite tenderness overlying his right mandible  as well as his right level 1, but again no frank masses or lesions. He has some shotty lymphadenopathy.  LUNGS: Respirations are unlabored.   IMAGING: CT scan is reviewed which reveals some inflammation along his right mandible including some fat straining extending to the floor of mouth and some shotty lymphadenopathy, more compatible with odontogenic infection.   IMPRESSION: Odontogenic infection with no evidence of any abscess or fluid collection on physical examination nor CT scan.   PLAN: I agree with Zosyn. We can switch this to clindamycin versus Augmentin once therapeutic improvement has been made. Would continue steroids for improvement of swelling. Consider continuation of the IV Decadron 4 mg q. 8 versus switching to oral Sterapred DS 6 day taper. There is no role for any surgical intervention at this point, and once symptomatic improvement has been made the patient is cleared to be discharged with quick followup with an oral surgeon for removal of the tooth. I will re-evaluate the patient later on this afternoon.   ____________________________ [name], MD ccv:TT D: 12/25/2013 17:00:52 ET T: 12/25/2013 20:34:00 ET JOB#: [id number]  cc: [name], MD, <Dictator> [name] MD ELECTRONICALLY SIGNED 01/04/2014 13:04

## 2014-05-12 NOTE — Consult Note (Signed)
Chief Complaint:  Subjective/Chief Complaint significantly improved pain from this morning.  Able to open mouth.  Tolerating diet.  Reports swelling improved.   VITAL SIGNS/ANCILLARY NOTES: **Vital Signs.:   07-Dec-15 15:57  Vital Signs Type Q 4hr  Temperature Temperature (F) 98.4  Celsius 36.8  Temperature Source oral  Pulse Pulse 85  Respirations Respirations 17  Systolic BP Systolic BP 488  Diastolic BP (mmHg) Diastolic BP (mmHg) 76  Mean BP 89  Pulse Ox % Pulse Ox % 94  Pulse Ox Activity Level  At rest  Oxygen Delivery Room Air/ 21 %   Brief Assessment:  GEN well developed, well nourished   Respiratory normal resp effort   Additional Physical Exam OC/OP- continued widening of posterior mandible near right 3rd molar.  No flucutance.  Floor of mouth soft. Neck- significant improvement of right neck edema and tenderness.  Continued small lymph nodes.   Radiology Results: CT:    07-Dec-15 06:06, CT Neck With Contrast  CT Neck With Contrast   REASON FOR EXAM:    right posterior, maxillary mollar pain with swelling   extending into the right si  COMMENTS:       PROCEDURE: CT  - CT NECK WITH CONTRAST  - Dec 25 2013  6:06AM     CLINICAL DATA:  History of right lower molar pain for 1 week.  Initial encounter.    EXAM:  CT NECK WITH CONTRAST    TECHNIQUE:  Multidetector CT imaging of the neck was performed using the  standard protocol following the bolus administration of intravenous  contrast.    CONTRAST:  75 cc of Isovue-300.    COMPARISON:  None available.    FINDINGS:  The visualized portions of the brain and orbits are unremarkable.    Visualized paranasal sinuses and mastoid air cells are clear.    The salivary glands including the parotid glands and submandibular  glands are within normal limits.    There is asymmetric soft tissue stranding stranding within the right  floor of mouth at the medial aspect of the right mandibular body  near the  retromolar trigone. Additionally, there is mild soft tissue  stranding within the right submandibular space, with thickening of  the right platysmas. Findings are suggestive of cellulitis, likely  odontogenic in origin. Prominent dental carie is present within the  within the adjacent right mandibular molars. There is phlegmonous  changes at the medial aspect of this molar in the right floor of  mouth without present, suspicious for early/developing odontogenic  abscess.    Palatine tonsils are symmetric. Parapharyngeal fat is preserved.  Nasopharynx within normal limits.    No retropharyngeal fluid collection. Epiglottis is normal. Vallecula  is clear. Hypopharynx and supraglottic larynx are normal. True vocal  cords within normal limits. Subglottic airway is clear.    Thyroid gland is unremarkable.    Mildly enlarged rightlevel IIa node measures 1.2 cm in short axis,  likely reactive. There are increased number of shoddy subcentimeter  right level 1B nodes, also likely reactive. No other adenopathy.    Visualized superior mediastinum within normal limits. Small amount  of free fluid present within the superior pericardial recess.    Visualized lungs are clear.    No acute osseous abnormality. No worrisome lytic or blastic osseous  lesion.  Normal intravascular enhancement seen within the neck.     IMPRESSION:  1. Inflammatory soft tissue stranding within the right floor of  mouth and right submandibular space, compatible  with cellulitis.  There are scattered dental caries within the adjacent right  mandibular molars, suggesting an odontogenic origin. Phlegmonous  changes present at the medial aspect of the right third molar on  image 36 is suggestive of early/developing odontogenic abscess.  2. Mildly prominent right level 1 and 2 adenopathy, likely reactive.      Electronically Signed    By: Jeannine Boga M.D.    On: 12/25/2013 06:35     Verified By: Neomia Glass, M.D.,   Assessment/Plan:  Assessment/Plan:  Assessment Odontogenic cellulitis   Plan 1)  Continue Zosyn and then switch to Clindamycin vs Augmentin for oral 2)  Needs oral surgical evaluation quickly as outpatient 3)  Sterapred DS 6 day 4)  Will re-eval in a.m., but given significant improvement over last 12 hours anticipate able to discharged per ENT perspective tomorrow   Electronic Signatures: Kellyann Ordway, Shela Leff (MD)  (Signed 07-Dec-15 17:35)  Authored: Chief Complaint, VITAL SIGNS/ANCILLARY NOTES, Brief Assessment, Radiology Results, Assessment/Plan   Last Updated: 07-Dec-15 17:35 by Pascal Lux (MD)

## 2014-05-20 NOTE — Consult Note (Signed)
PATIENT NAME:  Zachary, Madden MR#:  [id number] DATE OF BIRTH:  10-02-1985  DATE OF CONSULTATION:  02/10/2014  CONSULTING PHYSICIAN:  Analysa Nutting K. Edeline Greening, MD  SUBJECTIVE: The patient was seen in consultation at [facility] [place]. The patient is a 29 year old white male who has been divorced for 3 years after being married for   5 years. The patient lives with a male roommate in an apartment. The patient is employed full time. The patient comes to [facility] Emergency Room with a chief complaint, "I have been using street drugs like Xanax, cocaine, OxyContin."   HISTORY OF PRESENT ILLNESS: The patient reports that he has been getting Xanax from the street for the past 2 weeks. In addition, he used cocaine last night for the first time and then he used OxyContin 20 mg about twice a week for the past 2 weeks because he is feeling depressed. He got worried and concerned and he decided to get help and came here for help.  PAST PSYCHIATRIC HISTORY: The patient reports that he was depressed and he was started on Lexapro 20 mg, which has really helped him with his depression. He was getting this medication from [place]. However, he could not keep his followup appointment because he was too busy with his job and could not take off from the job. His last appointment at [facility] was in August 2015.   ALCOHOL AND DRUGS: He has an occasional drink of alcohol. Denies drinking alcohol on a regular basis.   MENTAL STATUS: The patient is alert and oriented, calm and cooperative. No agitation. Affect is flat. His mood is depressed. Admits to feeling hopeless and helpless. Admits to feeling to worthless. Admits that he feels low and down, but he wants to get help for his depression. Denies suicidal or homicidal plans and contracts for safety and wants to go back to work. No psychosis. Does not appear to be responding to internal stimuli. Insight and judgment are fair. Impulse control is fair.  IMPRESSION: Major depressive  disorder, recurrent, mild.  RECOMMENDATIONS: Recommend to start the patient back on Lexapro 20 mg p.o. daily as this has helped him in the past. I will start him on trazodone 50 mg p.o. at bedtime as the patient reports that he is not getting enough rest at night. Give the patient enough medication to last until his appointment at [facility] as he is eager to go back home and be followed by [place].        ____________________________ Wallace Cullens. Franchot Mimes, MD skc:ts D: 02/10/2014 17:39:22 ET T: 02/10/2014 21:49:59 ET JOB#: [id number]  cc: Arlyn Leak K. Franchot Mimes, MD, <Dictator> Dewain Penning MD ELECTRONICALLY SIGNED 02/11/2014 12:32

## 2014-06-23 ENCOUNTER — Emergency Department
Admission: EM | Admit: 2014-06-23 | Discharge: 2014-06-24 | Disposition: A | Discharge status: Home / Self Care | Payer: Self-pay | Attending: Student | Admitting: Student

## 2014-06-23 ENCOUNTER — Encounter: Payer: Self-pay | Admitting: *Deleted

## 2014-06-23 DIAGNOSIS — Z79899 Other long term (current) drug therapy: Secondary | ICD-10-CM | POA: Insufficient documentation

## 2014-06-23 DIAGNOSIS — F1110 Opioid abuse, uncomplicated: Secondary | ICD-10-CM | POA: Insufficient documentation

## 2014-06-23 DIAGNOSIS — Z87891 Personal history of nicotine dependence: Secondary | ICD-10-CM | POA: Insufficient documentation

## 2014-06-23 DIAGNOSIS — F1410 Cocaine abuse, uncomplicated: Secondary | ICD-10-CM | POA: Insufficient documentation

## 2014-06-23 DIAGNOSIS — F1910 Other psychoactive substance abuse, uncomplicated: Secondary | ICD-10-CM

## 2014-06-23 DIAGNOSIS — F1510 Other stimulant abuse, uncomplicated: Secondary | ICD-10-CM | POA: Insufficient documentation

## 2014-06-23 DIAGNOSIS — F1320 Sedative, hypnotic or anxiolytic dependence, uncomplicated: Secondary | ICD-10-CM | POA: Insufficient documentation

## 2014-06-23 LAB — URINE DRUG SCREEN, QUALITATIVE (ARMC ONLY)
AMPHETAMINES, UR SCREEN: POSITIVE — AB
BARBITURATES, UR SCREEN: NOT DETECTED
Benzodiazepine, Ur Scrn: POSITIVE — AB
COCAINE METABOLITE, UR ~~LOC~~: POSITIVE — AB
Cannabinoid 50 Ng, Ur ~~LOC~~: NOT DETECTED
MDMA (Ecstasy)Ur Screen: NOT DETECTED
METHADONE SCREEN, URINE: NOT DETECTED
OPIATE, UR SCREEN: POSITIVE — AB
PHENCYCLIDINE (PCP) UR S: NOT DETECTED
Tricyclic, Ur Screen: NOT DETECTED

## 2014-06-23 LAB — COMPREHENSIVE METABOLIC PANEL
ALBUMIN: 4.2 g/dL (ref 3.5–5.0)
ALK PHOS: 78 U/L (ref 38–126)
ALT: 34 U/L (ref 17–63)
AST: 43 U/L — ABNORMAL HIGH (ref 15–41)
Anion gap: 9 (ref 5–15)
BUN: 12 mg/dL (ref 6–20)
CO2: 29 mmol/L (ref 22–32)
Calcium: 9.2 mg/dL (ref 8.9–10.3)
Chloride: 99 mmol/L — ABNORMAL LOW (ref 101–111)
Creatinine, Ser: 1.07 mg/dL (ref 0.61–1.24)
GFR calc Af Amer: >60 mL/min (ref >60)
Glucose, Bld: 97 mg/dL (ref 65–99)
Potassium: 3.5 mmol/L (ref 3.5–5.1)
SODIUM: 137 mmol/L (ref 135–145)
Total Bilirubin: 0.6 mg/dL (ref 0.3–1.2)
Total Protein: 7.5 g/dL (ref 6.5–8.1)

## 2014-06-23 LAB — PROTIME-INR
INR: 1
PROTHROMBIN TIME: 13.4 s (ref 11.4–15.0)

## 2014-06-23 LAB — CBC
HEMATOCRIT: 42.7 % (ref 40.0–52.0)
Hemoglobin: 14.3 g/dL (ref 13.0–18.0)
MCH: 30 pg (ref 26.0–34.0)
MCHC: 33.6 g/dL (ref 32.0–36.0)
MCV: 89.4 fL (ref 80.0–100.0)
Platelets: 285 10*3/uL (ref 150–440)
RBC: 4.77 MIL/uL (ref 4.40–5.90)
RDW: 12.8 % (ref 11.5–14.5)
WBC: 6.4 10*3/uL (ref 3.8–10.6)

## 2014-06-23 LAB — SALICYLATE LEVEL: Salicylate Lvl: <4 mg/dL (ref 2.8–30.0)

## 2014-06-23 LAB — ACETAMINOPHEN LEVEL

## 2014-06-23 LAB — ETHANOL: Alcohol, Ethyl (B): <5 mg/dL (ref <5)

## 2014-06-23 MED ORDER — ONDANSETRON 4 MG PO TBDP
ORAL_TABLET | ORAL | Status: AC
  Filled 2014-06-23: qty 1

## 2014-06-23 MED ORDER — LORAZEPAM 2 MG PO TABS: 0.0000 mg | ORAL_TABLET | Freq: Two times a day (BID) | ORAL | Status: DC | PRN

## 2014-06-23 MED ORDER — LORAZEPAM 2 MG PO TABS: 0.0000 mg | ORAL_TABLET | Freq: Two times a day (BID) | ORAL | Status: DC

## 2014-06-23 MED ORDER — LORAZEPAM 1 MG PO TABS
ORAL_TABLET | ORAL | Status: AC
  Administered 2014-06-23: 1 mg via ORAL
  Filled 2014-06-23: qty 1

## 2014-06-23 MED ORDER — LORAZEPAM 2 MG/ML IJ SOLN: 0.0000 mg | Freq: Two times a day (BID) | INTRAMUSCULAR | Status: DC | PRN

## 2014-06-23 MED ORDER — NALOXONE HCL 1 MG/ML IJ SOLN
INTRAMUSCULAR | Status: AC
  Administered 2014-06-23: 0.4 mg via INTRAVENOUS
  Filled 2014-06-23: qty 2

## 2014-06-23 MED ORDER — LORAZEPAM 2 MG/ML IJ SOLN: 0.0000 mg | Freq: Four times a day (QID) | INTRAMUSCULAR | Status: DC

## 2014-06-23 MED ORDER — NALOXONE HCL 1 MG/ML IJ SOLN
0.4000 mg | Freq: Once | INTRAMUSCULAR | Status: AC
  Administered 2014-06-23: 0.4 mg via INTRAVENOUS

## 2014-06-23 MED ORDER — LORAZEPAM 2 MG/ML IJ SOLN
0.0000 mg | Freq: Four times a day (QID) | INTRAMUSCULAR | Status: DC | PRN
Start: 2014-06-23 — End: 2014-06-24

## 2014-06-23 MED ORDER — LORAZEPAM 2 MG PO TABS
0.0000 mg | ORAL_TABLET | Freq: Four times a day (QID) | ORAL | Status: DC
Start: 2014-06-23 — End: 2014-06-23

## 2014-06-23 MED ORDER — LORAZEPAM 2 MG PO TABS
0.0000 mg | ORAL_TABLET | Freq: Four times a day (QID) | ORAL | Status: DC | PRN
  Administered 2014-06-23: 1 mg via ORAL

## 2014-06-23 MED ORDER — ONDANSETRON 4 MG PO TBDP
4.0000 mg | ORAL_TABLET | Freq: Once | ORAL | Status: AC
  Administered 2014-06-23: 4 mg via ORAL

## 2014-06-23 MED ORDER — LORAZEPAM 2 MG PO TABS
0.0000 mg | ORAL_TABLET | Freq: Two times a day (BID) | ORAL | Status: DC
Start: 2014-06-23 — End: 2014-06-23

## 2014-06-23 MED ORDER — ONDANSETRON HCL 4 MG/2ML IJ SOLN
INTRAMUSCULAR | Status: AC
Start: 2014-06-23 — End: 2014-06-23
  Administered 2014-06-23: 4 mg via INTRAVENOUS
  Filled 2014-06-23: qty 2

## 2014-06-23 MED ORDER — LORAZEPAM 2 MG/ML IJ SOLN: 0.0000 mg | Freq: Two times a day (BID) | INTRAMUSCULAR | Status: DC

## 2014-06-23 MED ORDER — VITAMIN B-1 100 MG PO TABS: 100.0000 mg | ORAL_TABLET | Freq: Every day | ORAL | Status: DC

## 2014-06-23 MED ORDER — ONDANSETRON HCL 4 MG/2ML IJ SOLN
4.0000 mg | INTRAMUSCULAR | Status: AC
  Administered 2014-06-23: 4 mg via INTRAVENOUS

## 2014-06-23 MED ORDER — THIAMINE HCL 100 MG/ML IJ SOLN
100.0000 mg | Freq: Every day | INTRAMUSCULAR | Status: DC
Start: 2014-06-23 — End: 2014-06-24

## 2014-06-23 NOTE — BH Assessment (Signed)
Assessment Note  Zachary Madden is an 29 y.o. male who presents to [facility] Emergency Department voluntarily with the presenting problem of substance abuse. Patient reports that he desires detox and has been using heroin and benzos. Patient reports that within the last 24 hours he has used a half of gram of heroin and also using xanax pills as well. Patient states that he goes to [facility] but feels that he needs more treatment. Patient reports depressive symptoms that include tearfulness, feelings of guilt, and feelings of irritability and anger. Patient states that he also uses Adderall and Klonopin as well. Patient reports previous treatment at [facility] and [facility] within the past 5 years. Patient reports he is connected with [facility] for medication management and desires to receive detox/treatment at [facility] if possible. Patient denies SI/HI/AVH.    Axis I: Sedative, hypnotic, or anxiolytic use disorder, Severe and Opioid use disorder, severe Axis III:  Past Medical History  Diagnosis Date  . Allergy   . Anxiety   . Cholelithiasis   . Mental disorder    Axis IV: other psychosocial or environmental problems, problems related to social environment and problems with primary support group  Past Medical History:  Past Medical History  Diagnosis Date  . Allergy   . Anxiety   . Cholelithiasis   . Mental disorder     No past surgical history on file.  Family History:  Family History  Problem Relation Age of Onset  . Anxiety disorder Mother   . Chronic fatigue Mother   . Heart disease Father   . Diabetes Father     Social History:  reports that he has quit smoking. His smokeless tobacco use includes Snuff. He reports that he uses illicit drugs (Benzodiazepines and Heroin). He reports that he does not drink alcohol.  Additional Social History:  Alcohol / Drug Use History of alcohol / drug use?: Yes Negative Consequences of Use: Personal relationships Withdrawal Symptoms: Nausea /  Vomiting, Irritability, Tremors, Weakness, Fever / Chills, Agitation Substance #1 Name of Substance 1: Heroin 1 - Age of First Use: 24 1 - Amount (size/oz): half a gram 1 - Frequency: weekly 1 - Duration: years 1 - Last Use / Amount: 06/23/14- half a gram (snorted) Substance #2 Name of Substance 2: Benzo 2 - Age of First Use: 15 2 - Amount (size/oz): 2mg   2 - Frequency: weekly 2 - Duration: years 2 - Last Use / Amount: 06/23/14- 2mg  of Xanax (not prescribed)   CIWA: CIWA-Ar BP: 110/87 mmHg Pulse Rate: 68 COWS:    Allergies: No Known Allergies  Home Medications:  (Not in a hospital admission)  OB/GYN Status:  No LMP for male patient.  General Assessment Data Location of Assessment: [facility] ED [facility] Assessment: In system Is this a Tele or Face-to-Face Assessment?: Face-to-Face Is this an Initial Assessment or a Re-assessment for this encounter?: Initial Assessment Marital status: Single Is patient pregnant?: No Pregnancy Status: No Living Arrangements: Non-relatives/Friends Can pt return to current living arrangement?: Yes Admission Status: Voluntary Is patient capable of signing voluntary admission?: Yes Referral Source: Self/Family/Friend Insurance type: None     Crisis Care Plan Living Arrangements: Non-relatives/Friends Name of Psychiatrist: [place] Name of Therapist: None  Education Status Is patient currently in school?: No  Risk to self with the past 6 months Suicidal Ideation: No Has patient been a risk to self within the past 6 months prior to admission? : No Suicidal Intent: No Has patient had any suicidal intent  within the past 6 months prior to admission? : No Is patient at risk for suicide?: No Suicidal Plan?: No Has patient had any suicidal plan within the past 6 months prior to admission? : No Access to Means: No What has been your use of drugs/alcohol within the last 12 months?: Benzo and Heroin Previous Attempts/Gestures: No How many times?:  0 Triggers for Past Attempts: None known Intentional Self Injurious Behavior: None Family Suicide History: No Recent stressful life event(s): Other (Comment) (drug use) Persecutory voices/beliefs?: No Depression: Yes Depression Symptoms: Tearfulness, Isolating, Guilt, Loss of interest in usual pleasures, Feeling worthless/self pity Substance abuse history and/or treatment for substance abuse?: No Suicide prevention information given to non-admitted patients: Not applicable  Risk to Others within the past 6 months Homicidal Ideation: No Does patient have any lifetime risk of violence toward others beyond the six months prior to admission? : No Thoughts of Harm to Others: No Current Homicidal Intent: No Current Homicidal Plan: No Access to Homicidal Means: No Identified Victim: None History of harm to others?: No Assessment of Violence: None Noted Violent Behavior Description: Patient is calm and cooperative Does patient have access to weapons?: No Criminal Charges Pending?: No Does patient have a court date: No Is patient on probation?: No  Psychosis Hallucinations: None noted Delusions: None noted  Mental Status Report Appearance/Hygiene: In hospital gown Eye Contact: Poor Motor Activity: Freedom of movement Speech: Soft Level of Consciousness: Sedated Mood: Helpless Affect: Appropriate to circumstance Anxiety Level: None Thought Processes: Coherent, Relevant Judgement: Impaired Orientation: Person, Place, Time, Situation  Cognitive Functioning Concentration: Decreased Memory: Recent Intact, Remote Intact IQ: Average Insight: Fair Impulse Control: Poor Appetite: Good Weight Loss: 0 Weight Gain: 1 Sleep: No Change Total Hours of Sleep: 6 Vegetative Symptoms: None  ADLScreening [facility] Assessment Services) Patient's cognitive ability adequate to safely complete daily activities?: Yes Patient able to express need for assistance with ADLs?: Yes Independently  performs ADLs?: Yes (appropriate for developmental age)  Prior Inpatient Therapy Prior Inpatient Therapy: Yes Prior Therapy Dates: unknown Prior Therapy Facilty/Provider(s): [facility] Reason for Treatment: SA Tx  Prior Outpatient Therapy Prior Outpatient Therapy: Yes Prior Therapy Dates: current Prior Therapy Facilty/Provider(s): [facility] Reason for Treatment: Med Mgmt Does patient have an ACCT team?: No Does patient have Intensive In-House Services?  : No Does patient have [facility] services? : No Does patient have P4CC services?: No  ADL Screening (condition at time of admission) Patient's cognitive ability adequate to safely complete daily activities?: Yes Is the patient deaf or have difficulty hearing?: No Does the patient have difficulty seeing, even when wearing glasses/contacts?: No Does the patient have difficulty concentrating, remembering, or making decisions?: No Patient able to express need for assistance with ADLs?: Yes Does the patient have difficulty dressing or bathing?: No Independently performs ADLs?: Yes (appropriate for developmental age) Does the patient have difficulty walking or climbing stairs?: No Weakness of Legs: None Weakness of Arms/Hands: None  Home Assistive Devices/Equipment Home Assistive Devices/Equipment: None  Therapy Consults (therapy consults require a physician order) PT Evaluation Needed: No OT Evalulation Needed: No SLP Evaluation Needed: No Abuse/Neglect Assessment (Assessment to be complete while patient is alone) Physical Abuse: Denies Verbal Abuse: Denies Sexual Abuse: Denies Exploitation of patient/patient's resources: Denies Self-Neglect: Denies Values / Beliefs Cultural Requests During Hospitalization: None Spiritual Requests During Hospitalization: None Consults Spiritual Care Consult Needed: No Social Work Consult Needed: No Regulatory affairs officer (For Healthcare) Does patient have an advance directive?: No Would patient like  information on creating  an advanced directive?: No - patient declined information    Additional Information 1:1 In Past 12 Months?: No CIRT Risk: No Elopement Risk: No Does patient have medical clearance?: No     Disposition:  Disposition Initial Assessment Completed for this Encounter: Yes  On Site Evaluation by:   Reviewed with Physician:    Harriet Masson 06/23/2014 3:18 PM

## 2014-06-23 NOTE — ED Notes (Signed)

## 2014-06-23 NOTE — ED Notes (Signed)
BEHAVIORAL HEALTH ROUNDING Patient sleeping: Yes.   Patient alert and oriented: yes Behavior appropriate: Yes.  ;  Nutrition and fluids offered: Yes  Toileting and hygiene offered: Yes  Sitter present: yes Law enforcement present: Yes  

## 2014-06-23 NOTE — ED Provider Notes (Addendum)
[facility] Emergency Department Provider Note  ____________________________________________  Time seen: Approximately 1:15 PM  I have reviewed the triage vital signs and the nursing notes.   HISTORY  Chief Complaint Drug Overdose    HPI Zachary Madden is a 29 y.o. male who comes in with his friend because of ongoing drug use problems. Patient states that he has been abusing Xanax daily for several months, he has a history of heroin abuse, and that he reinitiated heroin today by snorting this morning. He denies being homicidal or suicidal. He states he would never hurt himself, but he is here at the request of his friend and himself to be evaluated for detox. He has previously been through detox a couple of times, and has been sober at periods however relapsing again.  He denies any pain. He states he last used Xanax likely this morning, can't quite recall. In addition he used heroin this morning. He does not shoot heroin.  He is not in any pain. He feels very sleepy. Per his friend, he has been very sleepy over the last 2 days. She finally encouraged him to come to the ER for evaluation and help.  Patient denies any history of suicide attempts. He has previously had to receive naloxone for opiate overdoses.   Past Medical History  Diagnosis Date  . Allergy   . Anxiety   . Cholelithiasis   . Mental disorder     Patient Active Problem List   Diagnosis Date Noted  . GAD (generalized anxiety disorder) 02/20/2013  . Panic attacks 02/20/2013  . Opiate dependence 02/19/2013  . Benzodiazepine dependence, continuous 02/18/2013  . 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 ALLERGIC RHINITIS 09/14/2008    No past surgical history on file.  Current Outpatient Rx  Name  Route  Sig  Dispense  Refill  . Multiple Vitamin (MULTIVITAMIN WITH MINERALS) TABS tablet   Oral   Take 1 tablet by mouth daily. For low vitamin         . traZODone (DESYREL) 100 MG tablet    Oral   Take 1 tablet (100 mg total) by mouth at bedtime as needed and may repeat dose one time if needed for sleep.   60 tablet   0     Allergies Review of patient's allergies indicates no known allergies.  Family History  Problem Relation Age of Onset  . Anxiety disorder Mother   . Chronic fatigue Mother   . Heart disease Father   . Diabetes Father     Social History History  Substance Use Topics  . Smoking status: Former Research scientist (life sciences)  . Smokeless tobacco: Current User    Types: Snuff  . Alcohol Use: No     Comment: Patient denies    Review of Systems Constitutional: No fever/chills Eyes: No visual changes. ENT: No sore throat. Cardiovascular: Denies chest pain. Respiratory: Denies shortness of breath. Gastrointestinal: No abdominal pain.  No nausea, no vomiting.  No diarrhea.  No constipation. Genitourinary: Negative for dysuria. Musculoskeletal: Negative for back pain. Skin: Negative for rash. Neurological: Negative for headaches, focal weakness or numbness.  Denies hallucinations. Denies any thoughts of hurting himself or others.  10-point ROS otherwise negative.  ____________________________________________   PHYSICAL EXAM:  VITAL SIGNS: ED Triage Vitals  Enc Vitals Group     BP 06/23/14 1231 126/76 mmHg     Pulse Rate 06/23/14 1231 93     Resp 06/23/14 1231 19     Temp 06/23/14 1231 97.7 F (  36.5 C)     Temp Source 06/23/14 1231 Oral     SpO2 06/23/14 1231 92 %     Weight --      Height --      Head Cir --      Peak Flow --      Pain Score --      Pain Loc --      Pain Edu? --      Excl. in [place]? --     Constitutional: Somnolent, alert to voice and is able to follow commands. Eyes: Conjunctivae are normal.  EOMI. Miosis.  Head: Atraumatic. Nose: No congestion/rhinnorhea. Mouth/Throat: Mucous membranes are moist.  Oropharynx non-erythematous. Neck: No stridor.   Cardiovascular: Normal rate, regular rhythm. Grossly normal heart sounds.  Good  peripheral circulation. Respiratory: Respirations are shallow and bradypneic with a respiratory rate of approximately 5-6 breaths per minute. This improves to 18-20 while alert, but while resting he is quite bradyipneic. No retractions. Lungs CTAB. Gastrointestinal: Soft and nontender. No distention. No abdominal bruits. No CVA tenderness. Musculoskeletal: No lower extremity tenderness nor edema.  No joint effusions. Neurologic:  Normal speech and language. No gross focal neurologic deficits are appreciated. Speech is normal. No gait instability. Skin:  Skin is warm, dry and intact. No rash noted. Psychiatric: Mood and affect are normal. Speech and behavior are normal while alert. The patient quickly falls back asleep if not spoken to.  ____________________________________________   LABS (all labs ordered are listed, but only abnormal results are displayed)  Labs Reviewed  COMPREHENSIVE METABOLIC PANEL - Abnormal; Notable for the following:    Chloride 99 (*)    AST 43 (*)    All other components within normal limits  ACETAMINOPHEN LEVEL - Abnormal; Notable for the following:    Acetaminophen (Tylenol), Serum <10 (*)    All other components within normal limits  URINE DRUG SCREEN, QUALITATIVE ([facility] ONLY) - Abnormal; Notable for the following:    Amphetamines, Ur Screen POSITIVE (*)    Cocaine Metabolite,Ur [place] POSITIVE (*)    Opiate, Ur Screen POSITIVE (*)    Benzodiazepine, Ur Scrn POSITIVE (*)    All other components within normal limits  CBC  ETHANOL  SALICYLATE LEVEL  PROTIME-INR   ____________________________________________  EKG   Date: 06/23/2014  Rate: 85  Rhythm: normal sinus rhythm  QRS Axis: normal  Intervals: normal  ST/T Wave abnormalities: normal, except for T wave inversion in V2 which may be normal variant  Conduction Disutrbances: none  Narrative Interpretation:  unremarkable     ____________________________________________  RADIOLOGY   ____________________________________________   PROCEDURES  Procedure(s) performed: None  Critical Care performed: No  ____________________________________________   INITIAL IMPRESSION / ASSESSMENT AND PLAN / ED COURSE  Pertinent labs & imaging results that were available during my care of the patient were reviewed by me and considered in my medical decision making (see chart for details).  Patient presents with probably drug ingestion. Per him and corroborating history this appears to be recreational. He is not homicidal or suicidal. He does have somnolence and low respiratory rate. Because of his mixed picture, I will give him naloxone due to his low resp rate. We will reevaluate thereafter. I suspect that benzodiazepine's are also causing a significant effect on his somnolence.  We will send psychiatric screening labs and I have requested consultation from our psychiatric triage service. We will attempt to place the patient in detox as he is voluntary and wishes to be enrolled. ____________________________________________ -----------------------------------------  1:48 PM on 06/23/2014 -----------------------------------------  Since miosis resolved after naloxone. He is awake and alert. He does state he feels nauseated and has slight sweats now. The patients mental status has improved significantly. At this point, we will continue to observe the patient for stability and to assure he does not have recurrent sedation.  Patient remained stable. Mental status is now alert. Has been seen by our psychiatric triage service, they're working to place the patient at [facility] for ongoing detox. Patient is agreeable with this plan.  ----------------------------------------- 3:53 PM on 06/23/2014 -----------------------------------------  Patient is fully awake and alert. He is eating a sandwich. He is calm,  appropriate, and ambulatory this time. On repeat questioning he again denies any desire to harm himself or anyone else. His medical screening labs are remarkable mostly for polysubstance abuse. We will await intake at [facility]. Patient is medically clear at this time. He is well beyond one hour after naloxone and shows no signs of recent duration.  We will transfer the patient to the [facility] pending placement.  FINAL CLINICAL IMPRESSION(S) / ED DIAGNOSES  Final diagnoses:  Polysubstance abuse  Benzodiazepine dependence, continuous    ----------------------------------------- 8:22 PM on 06/23/2014 -----------------------------------------  Patient reminds calm and appropriate. I've placed him on CIWA as we continue to wait detox placement. He currently shows no evidence of acute withdrawal although. Care and disposition signed out to Dr. Archie Balboa at this time.   Delman Kitten, MD 06/23/14 1555  Delman Kitten, MD 06/23/14 2023

## 2014-06-23 NOTE — ED Notes (Signed)
Report received from Lauren, RN.

## 2014-06-23 NOTE — ED Notes (Signed)
Pt awake at this time requesting something to eat, pt given sandwich tray at this time. Pt sitting up in bed eating, tolerating well.

## 2014-06-23 NOTE — ED Notes (Signed)
BEHAVIORAL HEALTH ROUNDING Patient sleeping: No. Patient alert and oriented: yes Behavior appropriate: Yes.  ; If no, describe:   Nutrition and fluids offered: Yes  Toileting and hygiene offered: Yes  Sitter present: no Law enforcement present: Yes  and ODS  ENVIRONMENTAL ASSESSMENT Potentially harmful objects out of patient reach: Yes.   Personal belongings secured: Yes.   Patient dressed in hospital provided attire only: Yes.   Plastic bags out of patient reach: Yes.   Patient care equipment (cords, cables, call bells, lines, and drains) shortened, removed, or accounted for: Yes.   Equipment and supplies removed from bottom of stretcher: Yes.   Potentially toxic materials out of patient reach: Yes.   Sharps container removed or out of patient reach: Yes.     ED BHU [place] Is the patient under IVC or is there intent for IVC: No.   Is the patient medically cleared: Yes.   Is there vacancy in the ED BHU: Yes.   Is the population mix appropriate for patient: Yes.   Is the patient awaiting placement in inpatient or outpatient setting: Yes.   Has the patient had a psychiatric consult: Yes.   Survey of unit performed for contraband, proper placement and condition of furniture, tampering with fixtures in bathroom, shower, and each patient room: Yes.  ; Findings:   APPEARANCE/BEHAVIOR calm and cooperative NEURO ASSESSMENT Orientation: time, place and person Hallucinations: No.None noted (Hallucinations) Speech: Normal Gait: normal RESPIRATORY ASSESSMENT Normal expansion.  Clear to auscultation.  No rales, rhonchi, or wheezing. CARDIOVASCULAR ASSESSMENT regular rate and rhythm, S1, S2 normal, no murmur, click, rub or gallop GASTROINTESTINAL ASSESSMENT soft, nontender, BS WNL, no r/g EXTREMITIES normal strength, tone, and muscle mass PLAN OF CARE Provide calm/safe environment. Vital signs assessed twice daily. ED BHU Assessment once each 12-hour shift. Collaborate  with intake RN daily or as condition indicates. Assure the ED provider has rounded once each shift. Provide and encourage hygiene. Provide redirection as needed. Assess for escalating behavior; address immediately and inform ED provider.  Assess family dynamic and appropriateness for visitation as needed: No.; If necessary, describe findings:   Educate the patient/family about BHU procedures/visitation: Yes.  ; If necessary, describe findings:

## 2014-06-23 NOTE — ED Notes (Signed)
Patient given food and drink per confirmation from [place], MD. Patient given warm blanket.

## 2014-06-23 NOTE — ED Notes (Signed)
Patient arrives to ED room, responsive to voice. Patient is A+O and obeys commands. Patient is however very lethargic with deep slow respirations

## 2014-06-23 NOTE — ED Notes (Signed)
Pt changed to behavioral scrubs and 2 belongings bags with [employer] duffel bag in pt locker room

## 2014-06-24 NOTE — ED Notes (Addendum)
Pt escorted to Roena Malady RN at this time.

## 2014-06-24 NOTE — ED Notes (Signed)
[place], [facility], states pt has beenn accepted and transportation will arrive at 0230

## 2014-06-24 NOTE — ED Notes (Signed)
BEHAVIORAL HEALTH ROUNDING Patient sleeping: No. Patient alert and oriented: yes Behavior appropriate: Yes.  ; If no, describe:   Nutrition and fluids offered: Yes  Toileting and hygiene offered: Yes  Sitter present: no Law enforcement present: Yes  and ODS  

## 2014-06-24 NOTE — ED Notes (Signed)
BEHAVIORAL HEALTH ROUNDING Patient sleeping: Yes.   Patient alert and oriented: no Behavior appropriate: Yes.  ; If no, describe:   Nutrition and fluids offered: No Toileting and hygiene offered: No Sitter present: no Law enforcement present: Yes  and ODS 

## 2014-06-24 NOTE — ED Notes (Signed)
Patient assigned to appropriate care area. Patient oriented to unit/care area: Informed that, for their safety, care areas are designed for safety and monitored by security cameras at all times; and visiting hours explained to patient. Patient verbalizes understanding, and verbal contract for safety obtained. 

## 2014-06-24 NOTE — Discharge Instructions (Signed)
Polysubstance Abuse °When people abuse more than one drug or type of drug it is called polysubstance or polydrug abuse. For example, many smokers also drink alcohol. This is one form of polydrug abuse. Polydrug abuse also refers to the use of a drug to counteract an unpleasant effect produced by another drug. It may also be used to help with withdrawal from another drug. People who take stimulants may become agitated. Sometimes this agitation is countered with a tranquilizer. This helps protect against the unpleasant side effects. Polydrug abuse also refers to the use of different drugs at the same time.  °Anytime drug use is interfering with normal living activities, it has become abuse. This includes problems with family and friends. Psychological dependence has developed when your mind tells you that the drug is needed. This is usually followed by physical dependence which has developed when continuing increases of drug are required to get the same feeling or "high". This is known as addiction or chemical dependency. A person's risk is much higher if there is a history of chemical dependency in the family. °SIGNS OF CHEMICAL DEPENDENCY °· You have been told by friends or family that drugs have become a problem. °· You fight when using drugs. °· You are having blackouts (not remembering what you do while using). °· You feel sick from using drugs but continue using. °· You lie about use or amounts of drugs (chemicals) used. °· You need chemicals to get you going. °· You are suffering in work performance or in school because of drug use. °· You get sick from use of drugs but continue to use anyway. °· You need drugs to relate to people or feel comfortable in social situations. °· You use drugs to forget problems. °"Yes" answered to any of the above signs of chemical dependency indicates there are problems. The longer the use of drugs continues, the greater the problems will become. °If there is a family history of  drug or alcohol use, it is best not to experiment with these drugs. Continual use leads to tolerance. After tolerance develops more of the drug is needed to get the same feeling. This is followed by addiction. With addiction, drugs become the most important part of life. It becomes more important to take drugs than participate in the other usual activities of life. This includes relating to friends and family. Addiction is followed by dependency. Dependency is a condition where drugs are now needed not just to get high, but to feel normal. °Addiction cannot be cured but it can be stopped. This often requires outside help and the care of professionals. Treatment centers are listed in the yellow pages under: Cocaine, Narcotics, and Alcoholics Anonymous. Most hospitals and clinics can refer you to a specialized care center. Talk to your caregiver if you need help. °Document Released: 08/27/2004 Document Revised: 03/30/2011 Document Reviewed: 01/05/2005 °ExitCare® Patient Information ©2015 [employer]. This information is not intended to replace advice given to you by your health care provider. Make sure you discuss any questions you have with your health care provider. ° °

## 2014-06-24 NOTE — ED Notes (Signed)
Pt dc'd to lobby to wait for [facility]

## 2014-12-18 ENCOUNTER — Ambulatory Visit: Payer: Self-pay | Admitting: Family Medicine

## 2014-12-18 DIAGNOSIS — Z0289 Encounter for other administrative examinations: Secondary | ICD-10-CM

## 2015-01-01 ENCOUNTER — Encounter: Payer: Self-pay | Admitting: Family Medicine

## 2015-01-01 ENCOUNTER — Ambulatory Visit (INDEPENDENT_AMBULATORY_CARE_PROVIDER_SITE_OTHER): Payer: 59 | Admitting: Family Medicine

## 2015-01-01 VITALS — BP 120/80 | Temp 98.2°F | Wt 182.0 lb

## 2015-01-01 DIAGNOSIS — D0359 Melanoma in situ of other part of trunk: Secondary | ICD-10-CM | POA: Insufficient documentation

## 2015-01-01 DIAGNOSIS — F410 Panic disorder [episodic paroxysmal anxiety] without agoraphobia: Secondary | ICD-10-CM

## 2015-01-01 DIAGNOSIS — M5432 Sciatica, left side: Secondary | ICD-10-CM | POA: Diagnosis not present

## 2015-01-01 DIAGNOSIS — F411 Generalized anxiety disorder: Secondary | ICD-10-CM | POA: Diagnosis not present

## 2015-01-01 MED ORDER — ESCITALOPRAM OXALATE 20 MG PO TABS: 20.0000 mg | ORAL_TABLET | Freq: Every day | ORAL | Status: AC

## 2015-01-01 MED ORDER — TRAMADOL HCL 50 MG PO TABS: ORAL_TABLET | ORAL | Status: DC

## 2015-01-01 NOTE — Progress Notes (Signed)
   Subjective:    Patient ID: Zachary Madden, male    DOB: 1985/03/18, 29 y.o.   MRN: [id number]  HPI Zachary Madden is a 29 year old single male nonsmoker who comes in to see [place] after a short absence because he didn't have health insurance. He went to an outpatient clinic in [place] and was given Lexapro 20 mg daily for his anxiety and panic attacks. That medication will has worked well he would like to continue that.  When we saw him a couple years ago he was having chronic low back pain. We sent him to neurosurgery that issue never got resolved. He's not taking his mother's tramadol  He tells me he's got a lesion on his back that he first noticed about 3 years ago. He says doubled in size in the past 12 months   Review of Systems Review of systems otherwise negative except he recently got health insurance through his job    Objective:   Physical Exam  Well-developed well-nourished male no acute distress vital signs stable he is afebrile examination of back shows a 10 mm x 5 mm black lesion that clinically is a melanoma  He was taken to the treatment room and after informed consent lesion was anesthetized with 1% Xylocaine with epinephrine and remove with 5 mm margins. The base was cauterized dry sterile dressing was applied. Lesion was sent for pathologic analysis but I'm concerned that clinically it is a melanoma      Assessment & Plan:  History of anxiety and panic attacks,,,,,,,, continue Lexapro 20 mg daily  Abnormal lesion on the back removed as noted above,,,,,,,, probably melanoma  Chronic low back pain,,,,,,,,,,,,,, referred back to neurosurgery

## 2015-01-01 NOTE — Patient Instructions (Signed)
Motrin 600 mg twice daily with food  Tramadol 50 mg,,,,,,, 1/2-1 tablet at bedtime for back pain  Williams exercises,,,,,,,, 15 minutes every morning  I put in a request for a neurosurgical consult for further evaluation  Continue Lexapro 20 mg daily  Within 2 weeks we will call you the report of the mole removal,,,,,,,,,, if we do not call you within 2 weeks you call us

## 2015-01-01 NOTE — Progress Notes (Signed)
Pre visit review using our clinic review tool, if applicable. No additional management support is needed unless otherwise documented below in the visit note. 

## 2015-01-09 ENCOUNTER — Other Ambulatory Visit: Payer: Self-pay | Admitting: Family Medicine

## 2015-01-09 DIAGNOSIS — C439 Malignant melanoma of skin, unspecified: Secondary | ICD-10-CM

## 2015-01-29 ENCOUNTER — Telehealth: Payer: Self-pay | Admitting: *Deleted

## 2015-01-29 NOTE — Telephone Encounter (Signed)
Patient is aware of his path report and a referral was placed.  Sonya from [facility] called today to report that Zachary Madden did not come for his appointment with Dr Sarina Ser.  She spoke with the patient and he states that he has problems with his insurance.  Dr Sherren Mocha was informed. Left message on machine for patient to reschedule his appointment and to review the importance of his appointment.

## 2015-02-05 ENCOUNTER — Telehealth: Payer: Self-pay | Admitting: Family Medicine

## 2015-02-05 NOTE — Telephone Encounter (Signed)
Dermatolgy called to let you know that the patient was referred for Melanoma and they called him to schedule an appointment and he said that he is getting it taken care of at the [facility].

## 2015-02-06 ENCOUNTER — Ambulatory Visit: Payer: Self-pay | Admitting: Family Medicine

## 2015-02-06 NOTE — Telephone Encounter (Signed)
Dr Sherren Mocha will discuss with patient at office visit today. Patient was a no show.

## 2015-02-08 ENCOUNTER — Encounter: Payer: Self-pay | Admitting: Family Medicine

## 2015-02-11 ENCOUNTER — Telehealth: Payer: Self-pay | Admitting: Family Medicine

## 2015-02-11 NOTE — Telephone Encounter (Signed)
Patient dismissed from [facility] by Stevie Kern MD , effective February 08, 2015. Dismissal letter sent out by certified / registered mail.  [facility]  Certified dismissal letter returned as undeliverable, unclaimed, return to sender after three attempts by [place]. Letter placed in another envelope and resent as 1st class mail which does not require a signature. 03/12/15 DAJ

## 2015-03-29 ENCOUNTER — Telehealth: Payer: Self-pay | Admitting: Family Medicine

## 2015-03-29 NOTE — Telephone Encounter (Signed)
Certified dismissal letter returned as undeliverable, unclaimed, return to sender by [employer]. Document scanned under media tab 03/29/15  DAJ

## 2015-06-21 ENCOUNTER — Emergency Department: Payer: Self-pay

## 2015-06-21 ENCOUNTER — Emergency Department
Admission: EM | Admit: 2015-06-21 | Discharge: 2015-06-22 | Disposition: A | Discharge status: Home / Self Care | Payer: Self-pay | Attending: Student | Admitting: Student

## 2015-06-21 DIAGNOSIS — F1193 Opioid use, unspecified with withdrawal: Secondary | ICD-10-CM

## 2015-06-21 DIAGNOSIS — F1120 Opioid dependence, uncomplicated: Secondary | ICD-10-CM | POA: Diagnosis present

## 2015-06-21 DIAGNOSIS — F1910 Other psychoactive substance abuse, uncomplicated: Secondary | ICD-10-CM

## 2015-06-21 DIAGNOSIS — R079 Chest pain, unspecified: Secondary | ICD-10-CM | POA: Insufficient documentation

## 2015-06-21 DIAGNOSIS — F1320 Sedative, hypnotic or anxiolytic dependence, uncomplicated: Secondary | ICD-10-CM | POA: Diagnosis present

## 2015-06-21 DIAGNOSIS — Z87891 Personal history of nicotine dependence: Secondary | ICD-10-CM | POA: Insufficient documentation

## 2015-06-21 DIAGNOSIS — F411 Generalized anxiety disorder: Secondary | ICD-10-CM | POA: Diagnosis present

## 2015-06-21 DIAGNOSIS — F1110 Opioid abuse, uncomplicated: Secondary | ICD-10-CM | POA: Insufficient documentation

## 2015-06-21 DIAGNOSIS — F1123 Opioid dependence with withdrawal: Secondary | ICD-10-CM

## 2015-06-21 DIAGNOSIS — F419 Anxiety disorder, unspecified: Secondary | ICD-10-CM | POA: Insufficient documentation

## 2015-06-21 DIAGNOSIS — F410 Panic disorder [episodic paroxysmal anxiety] without agoraphobia: Secondary | ICD-10-CM | POA: Diagnosis present

## 2015-06-21 LAB — CBC WITH DIFFERENTIAL/PLATELET
Basophils Absolute: 0 10*3/uL (ref 0–0.1)
Basophils Relative: 0 %
EOS ABS: 0 10*3/uL (ref 0–0.7)
Eosinophils Relative: 0 %
HEMATOCRIT: 41.8 % (ref 40.0–52.0)
Hemoglobin: 14.9 g/dL (ref 13.0–18.0)
LYMPHS ABS: 1 10*3/uL (ref 1.0–3.6)
MCH: 30.1 pg (ref 26.0–34.0)
MCHC: 35.6 g/dL (ref 32.0–36.0)
MCV: 84.5 fL (ref 80.0–100.0)
Monocytes Absolute: 0.5 10*3/uL (ref 0.2–1.0)
NEUTROS ABS: 8.2 10*3/uL — AB (ref 1.4–6.5)
Platelets: 379 10*3/uL (ref 150–440)
RBC: 4.94 MIL/uL (ref 4.40–5.90)
RDW: 13.3 % (ref 11.5–14.5)
WBC: 9.6 10*3/uL (ref 3.8–10.6)

## 2015-06-21 LAB — COMPREHENSIVE METABOLIC PANEL
ALBUMIN: 4.5 g/dL (ref 3.5–5.0)
ALT: 17 U/L (ref 17–63)
ANION GAP: 13 (ref 5–15)
AST: 23 U/L (ref 15–41)
Alkaline Phosphatase: 77 U/L (ref 38–126)
BUN: 11 mg/dL (ref 6–20)
CO2: 23 mmol/L (ref 22–32)
Calcium: 9.2 mg/dL (ref 8.9–10.3)
Chloride: 102 mmol/L (ref 101–111)
Creatinine, Ser: 0.81 mg/dL (ref 0.61–1.24)
GFR calc Af Amer: >60 mL/min (ref >60)
GFR calc non Af Amer: >60 mL/min (ref >60)
GLUCOSE: 117 mg/dL — AB (ref 65–99)
POTASSIUM: 3.7 mmol/L (ref 3.5–5.1)
SODIUM: 138 mmol/L (ref 135–145)
Total Bilirubin: 0.8 mg/dL (ref 0.3–1.2)
Total Protein: 8 g/dL (ref 6.5–8.1)

## 2015-06-21 LAB — ACETAMINOPHEN LEVEL: Acetaminophen (Tylenol), Serum: <10 ug/mL — ABNORMAL LOW (ref 10–30)

## 2015-06-21 LAB — SALICYLATE LEVEL

## 2015-06-21 LAB — ETHANOL: Alcohol, Ethyl (B): <5 mg/dL (ref <5)

## 2015-06-21 LAB — TROPONIN I: Troponin I: <0.03 ng/mL (ref <0.031)

## 2015-06-21 LAB — FIBRIN DERIVATIVES D-DIMER (ARMC ONLY): Fibrin derivatives D-dimer (ARMC): 128 (ref 0–499)

## 2015-06-21 MED ORDER — LORAZEPAM 1 MG PO TABS
1.0000 mg | ORAL_TABLET | Freq: Once | ORAL | Status: AC
  Administered 2015-06-21: 1 mg via ORAL
  Filled 2015-06-21: qty 1

## 2015-06-21 MED ORDER — LORAZEPAM 2 MG PO TABS
2.0000 mg | ORAL_TABLET | Freq: Once | ORAL | Status: AC
  Administered 2015-06-21: 2 mg via ORAL
  Filled 2015-06-21: qty 1

## 2015-06-21 MED ORDER — SODIUM CHLORIDE 0.9 % IV BOLUS (SEPSIS)
1000.0000 mL | Freq: Once | INTRAVENOUS | Status: AC
  Administered 2015-06-21: 1000 mL via INTRAVENOUS

## 2015-06-21 MED ORDER — LORAZEPAM 2 MG/ML IJ SOLN
INTRAMUSCULAR | Status: AC
  Administered 2015-06-21: 10:00:00
  Filled 2015-06-21: qty 1

## 2015-06-21 NOTE — ED Notes (Signed)
Pt resting, girlfriend at bedside.

## 2015-06-21 NOTE — BH Assessment (Signed)
Writer discussed with the patient about the option of [facility], for detox. Patient was in the agreement with the plan. Patient signed the Release of information, in order to send labs and ER Note. Information sent sent to [facility] via fax and confirmed it was received ([phone]).  Patient currently pending review.

## 2015-06-21 NOTE — ED Provider Notes (Signed)
[facility] Emergency Department Provider Note   ____________________________________________  Time seen: Approximately 9:00 AM  I have reviewed the triage vital signs and the nursing notes.   HISTORY  Chief Complaint Anxiety "I just want to be able to calm down."   HPI Zachary Madden is a 30 y.o. male the chin anxiety, polysubstance abuse who presents for evaluation of severe anxiety today, gradual onset, constant, no modifying factors. Patient reports that his anxiety has been increasing over the past several weeks, it is been severe today. He has been taking his friend's Xanax to self treat the anxiety. He is also complaining of vague chest pain but his primary concern is the anxiety. No shortness of breath, no vomiting, diarrhea, fevers or chills. No SI, HI or audiovisual hallucinations. He has been also using heroin over the past few days.   Past Medical History  Diagnosis Date  . Allergy   . Anxiety   . Cholelithiasis   . Mental disorder     Patient Active Problem List   Diagnosis Date Noted  . Opiate withdrawal ([place]) 06/21/2015  . Melanoma in situ of back ([place]) 01/01/2015  . GAD (generalized anxiety disorder) 02/20/2013  . Panic attacks 02/20/2013  . Opiate dependence ([place]) 02/19/2013  . Benzodiazepine dependence, continuous ([place]) 02/18/2013  . 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 ALLERGIC RHINITIS 09/14/2008    No past surgical history on file.  Current Outpatient Rx  Name  Route  Sig  Dispense  Refill  . escitalopram (LEXAPRO) 20 MG tablet   Oral   Take 1 tablet (20 mg total) by mouth at bedtime.   100 tablet   3   . Multiple Vitamin (MULTIVITAMIN WITH MINERALS) TABS tablet   Oral   Take 1 tablet by mouth daily. For low vitamin         . traMADol (ULTRAM) 50 MG tablet      1/2-1 tablet at bedtime when necessary for back pain   60 tablet   1     Allergies Review of patient's allergies indicates no known  allergies.  Family History  Problem Relation Age of Onset  . Anxiety disorder Mother   . Chronic fatigue Mother   . Heart disease Father   . Diabetes Father     Social History Social History  Substance Use Topics  . Smoking status: Former Research scientist (life sciences)  . Smokeless tobacco: Current User    Types: Snuff  . Alcohol Use: No     Comment: Patient denies    Review of Systems Constitutional: No fever/chills Eyes: No visual changes. ENT: No sore throat. Cardiovascular: + chest pain. Respiratory: Denies shortness of breath. Gastrointestinal: No abdominal pain.  No nausea, no vomiting.  No diarrhea.  No constipation. Genitourinary: Negative for dysuria. Musculoskeletal: Negative for back pain. Skin: Negative for rash. Neurological: Negative for headaches, focal weakness or numbness.  10-point ROS otherwise negative.  ____________________________________________   PHYSICAL EXAM:  VITAL SIGNS: ED Triage Vitals  Enc Vitals Group     BP 06/21/15 0818 125/85 mmHg     Pulse Rate 06/21/15 0818 110     Resp 06/21/15 0818 20     Temp 06/21/15 0818 98.8 F (37.1 C)     Temp Source 06/21/15 0818 Axillary     SpO2 06/21/15 0818 99 %     Weight 06/21/15 0818 170 lb (77.111 kg)     Height 06/21/15 0818 5\' 5"  (1.651 m)     Head Cir --  Peak Flow --      Pain Score 06/21/15 0820 8     Pain Loc --      Pain Edu? --      Excl. in [place]? --     Constitutional: Alert and oriented. The patient is severely anxious-appearing, sitting up in bed holding himself in good, rocking back and forth. Eyes: Conjunctivae are normal. PERRL. EOMI. Head: Atraumatic. Nose: No congestion/rhinnorhea. Mouth/Throat: Mucous membranes are moist.  Oropharynx non-erythematous. Neck: No stridor.  Supple without meningismus. Cardiovascular: Tachycardic rate, regular rhythm. Grossly normal heart sounds.  Good peripheral circulation. Respiratory: Normal respiratory effort.  No retractions. Lungs  CTAB. Gastrointestinal: Soft and nontender. No distention.  No CVA tenderness. Genitourinary: deferred Musculoskeletal: No lower extremity tenderness nor edema.  No joint effusions. Neurologic:  Normal speech and language. No gross focal neurologic deficits are appreciated. No gait instability. Skin:  Skin is warm, dry and intact. No rash noted. Psychiatric: Mood is severely anxious, affect is normal.  ____________________________________________   LABS (all labs ordered are listed, but only abnormal results are displayed)  Labs Reviewed  CBC WITH DIFFERENTIAL/PLATELET - Abnormal; Notable for the following:    Neutro Abs 8.2 (*)    All other components within normal limits  COMPREHENSIVE METABOLIC PANEL - Abnormal; Notable for the following:    Glucose, Bld 117 (*)    All other components within normal limits  ACETAMINOPHEN LEVEL - Abnormal; Notable for the following:    Acetaminophen (Tylenol), Serum <10 (*)    All other components within normal limits  TROPONIN I  FIBRIN DERIVATIVES D-DIMER ([facility] ONLY)  ETHANOL  SALICYLATE LEVEL   ____________________________________________  EKG  ED ECG REPORT I, Joanne Gavel, the attending physician, personally viewed and interpreted this ECG.   Date: 06/21/2015  EKG Time: 08:25  Rate: 107  Rhythm: sinus tachycardia  Axis: normal  Intervals:none  ST&T Change: No acute ST elevation.  ____________________________________________  RADIOLOGY  CXR  IMPRESSION: No active cardiopulmonary disease. ____________________________________________   PROCEDURES  Procedure(s) performed: None  Critical Care performed: No  ____________________________________________   INITIAL IMPRESSION / ASSESSMENT AND PLAN / ED COURSE  Pertinent labs & imaging results that were available during my care of the patient were reviewed by me and considered in my medical decision making (see chart for details).  Zachary Madden is a 30 y.o. male  the chin anxiety, polysubstance abuse who presents for evaluation of severe anxiety today, gradual onset, constant, no modifying factors. On exam, he is severely anxious-appearing. He is mildly tachycardic but the remainder of his vital signs are stable, he is afebrile. He has a vague complaint of chest pain, the EKG is reassuring, not consistent with ischemia, we'll obtain a troponin as well as d-dimer given his mild tachycardia here. Chest x-ray pending. We'll obtain screening psychiatric labs, consult behavioral health and psychiatry, treat him symptomatically for anxiety. Reassess for disposition.  ----------------------------------------- 4:47 PM on 06/21/2015 ----------------------------------------- Labs reviewed and are generally unremarkable including a negative troponin and d-dimer was not elevated. Doubt ACS or PE as the cause of his nonspecific chest discomfort. Chest x-ray clear. Undetectable acetaminophen, salicylate and ethanol levels. Tachycardia resolved, anxiety is improving the patient is resting comfortably. Per Kerry Dory, the patient will have a bed at [facility] but not until 3 AM. He will wait in the ER.  ____________________________________________   FINAL CLINICAL IMPRESSION(S) / ED DIAGNOSES  Final diagnoses:  Anxiety  Substance abuse      NEW MEDICATIONS STARTED DURING THIS  VISIT:  New Prescriptions   No medications on file     Note:  This document was prepared using Dragon voice recognition software and may include unintentional dictation errors.    Joanne Gavel, MD 06/21/15 [phone]

## 2015-06-21 NOTE — ED Notes (Addendum)
Pt states that he has been dealing with a lot of anxiety, states that for the past three days he has been using his roomates xanax to help deal with it, pt also states that he used heroin yesterday, pt is breathing hard and rocking all over, pt is tearful and states that he feels awful, pt's friend is here with him, drove him in.

## 2015-06-21 NOTE — ED Notes (Signed)
Pt resting in bed, on cell phone at this time, appears to be calm, no distress noted.

## 2015-06-21 NOTE — ED Notes (Signed)
Pt seen by Maudie Mercury, fiance

## 2015-06-21 NOTE — BH Assessment (Signed)
Received phone call from [facility] ([phone]) stating they will take the patient but they will not be able to admit him until 3am. They have several admission scheduled up to that time. Writer talked with ER MD (Dr. Edd Fabian) and she's okay with the patient remaining in the ER, till [facility] come and get him. Writer updated the patient and his nurse Actor).

## 2015-06-21 NOTE — BH Assessment (Signed)
Assessment Note  Zachary Madden is an 30 y.o. male who presents to the ER seeking assistance with detox and SA Treatment. Patient reports of using; Heroin, Cannabis & Xanax. Last time of use of Heroin was yesterday (06/20/2015) and the last use for Xanax was earlier this week.  His current symptoms are; Anxiety, chest pain & achiness. He denies history of seizures & blackouts. He denies SI/HI and AV/H.  Due to his drug use, he lost his job and currently at risk of begin homeless.  Currently on probation for Possession Drug Paraphernalia. Has an upcoming court date on 08/08/2015 for [place], in [place]. Another court date in [place], on 08/13/2015. Charges are; Infraction FAIL MAINTAIN LANE CONTROL.  Diagnosis: Substance Use Disorder, Severe Past Medical History:  Past Medical History  Diagnosis Date  . Allergy   . Anxiety   . Cholelithiasis   . Mental disorder     No past surgical history on file.  Family History:  Family History  Problem Relation Age of Onset  . Anxiety disorder Mother   . Chronic fatigue Mother   . Heart disease Father   . Diabetes Father     Social History:  reports that he has quit smoking. His smokeless tobacco use includes Snuff. He reports that he uses illicit drugs (Benzodiazepines and Heroin). He reports that he does not drink alcohol.  Additional Social History:  Alcohol / Drug Use Pain Medications: See PTA Prescriptions: See PTA Over the Counter: See PTA History of alcohol / drug use?: Yes Longest period of sobriety (when/how long): 9 months Negative Consequences of Use: Personal relationships, Legal, Financial Withdrawal Symptoms: Fever / Chills, Weakness, Tremors Substance #1 Name of Substance 1: Heroin 1 - Age of First Use: 27 1 - Amount (size/oz): "Like $40 at time (day)." 1 - Frequency: 3 to 4 days a week 1 - Duration: "For about three weeks" 1 - Last Use / Amount: 06/20/2015 Substance  #2 Name of Substance 2: Xanax 2 - Age of First Use: 16 2 - Amount (size/oz): 4mg  2 - Frequency: Daily 2 - Duration: 3 weeks 2 - Last Use / Amount: 06/17/2015 Substance #3 Name of Substance 3: Cannabis 3 - Age of First Use: 15 3 - Amount (size/oz): 1 gram 3 - Frequency: 2 days out of a week 3 - Duration: unable to quantify 3 - Last Use / Amount: 05/31/2015  CIWA: CIWA-Ar BP: 125/85 mmHg Pulse Rate: (!) 110 COWS:    Allergies: No Known Allergies  Home Medications:  (Not in a hospital admission)  OB/GYN Status:  No LMP for male patient.  General Assessment Data Location of Assessment: [facility] ED TTS Assessment: In system Is this a Tele or Face-to-Face Assessment?: Face-to-Face Is this an Initial Assessment or a Re-assessment for this encounter?: Initial Assessment Marital status: Divorced [place] name: n/a Is patient pregnant?: No Pregnancy Status: No Living Arrangements: Other (Comment) ("I rent a room from a friend.") Can pt return to current living arrangement?: Yes Admission Status: Voluntary Is patient capable of signing voluntary admission?: Yes Referral Source: Self/Family/Friend Insurance type: None  Medical Screening Exam ([place]) Medical Exam completed: Yes  Crisis Care Plan Living Arrangements: Other (Comment) ("I rent a room from a friend.") Legal Guardian: Other: (None) Name of Psychiatrist: Reports of none Name of Therapist: Reports of none  Education Status Is patient currently in school?: No Current Grade: n/a Highest grade of school patient has completed: [employer] Name of school:  n/a Contact person: n/a  Risk to self with the past 6 months Suicidal Ideation: No Has patient been a risk to self within the past 6 months prior to admission? : No Suicidal Intent: No Has patient had any suicidal intent within the past 6 months prior to admission? : No Is patient at risk for suicide?: No Suicidal Plan?: No Has patient had any suicidal  plan within the past 6 months prior to admission? : No Access to Means: No What has been your use of drugs/alcohol within the last 12 months?: Heroin, Cannabis & Xanax  Previous Attempts/Gestures: No How many times?: 0 Other Self Harm Risks: Active Addiction Triggers for Past Attempts: None known Intentional Self Injurious Behavior: None Family Suicide History: No Recent stressful life event(s): Other (Comment), Loss (Comment), Financial Problems (Active Addiction) Persecutory voices/beliefs?: No Depression: Yes Depression Symptoms: Feeling worthless/self pity, Loss of interest in usual pleasures, Fatigue, Guilt, Isolating, Tearfulness Substance abuse history and/or treatment for substance abuse?: Yes Suicide prevention information given to non-admitted patients: Not applicable  Risk to Others within the past 6 months Homicidal Ideation: No Does patient have any lifetime risk of violence toward others beyond the six months prior to admission? : No Thoughts of Harm to Others: No Current Homicidal Intent: No Current Homicidal Plan: No Access to Homicidal Means: No Identified Victim: Reports of none History of harm to others?: No Assessment of Violence: None Noted Violent Behavior Description: Reports of none Does patient have access to weapons?: No Criminal Charges Pending?: Yes Describe Pending Criminal Charges: Possession Drug Paraphernalia Does patient have a court date: Yes Court Date: 07/09/15 [facility]) Is patient on probation?: Yes (Due to possession of stolen property, misdemeanor)  Psychosis Hallucinations: None noted Delusions: None noted  Mental Status Report Appearance/Hygiene: Unremarkable, In scrubs, In hospital gown Eye Contact: Fair Motor Activity: Freedom of movement, Unremarkable Speech: Logical/coherent, Unremarkable Level of Consciousness: Alert Mood: Depressed, Anxious, Sad, Helpless, Pleasant Affect: Appropriate to circumstance, Sad,  Depressed Anxiety Level: Minimal Thought Processes: Coherent, Relevant Judgement: Partial Orientation: Person, Place, Time, Situation, Appropriate for developmental age Obsessive Compulsive Thoughts/Behaviors: Minimal  Cognitive Functioning Concentration: Normal Memory: Recent Intact, Remote Intact IQ: Average Insight: Fair Impulse Control: Poor Appetite: Poor Weight Loss: 15 (Within 30 days) Weight Gain: 0 Sleep: Decreased Total Hours of Sleep: 2 Vegetative Symptoms: None  ADLScreening [facility]) Patient's cognitive ability adequate to safely complete daily activities?: Yes Patient able to express need for assistance with ADLs?: Yes Independently performs ADLs?: Yes (appropriate for developmental age)  Prior Inpatient Therapy Prior Inpatient Therapy: Yes Prior Therapy Dates: Winter 2016,  2015 & 2014 Prior Therapy Facilty/Provider([facility]): [facility] Reason for Treatment: Detox & SA Treatment  Prior Outpatient Therapy Prior Outpatient Therapy: No Prior Therapy Dates: Reports of none Prior Therapy Facilty/Provider(s): Reports of none Reason for Treatment: Reports of none Does patient have an ACCT team?: No Does patient have Intensive In-House Services?  : No Does patient have [facility] services? : No Does patient have P4CC services?: No  ADL Screening (condition at time of admission) Patient's cognitive ability adequate to safely complete daily activities?: Yes Is the patient deaf or have difficulty hearing?: No Does the patient have difficulty seeing, even when wearing glasses/contacts?: No Does the patient have difficulty concentrating, remembering, or making decisions?: No Patient able to express need for assistance with ADLs?: Yes Does the patient have difficulty dressing or bathing?: No Independently performs ADLs?: Yes (appropriate for developmental age) Does the patient have difficulty  walking or climbing stairs?: No Weakness of Legs:  None Weakness of Arms/Hands: None  Home Assistive Devices/Equipment Home Assistive Devices/Equipment: None  Therapy Consults (therapy consults require a physician order) PT Evaluation Needed: No OT Evalulation Needed: No SLP Evaluation Needed: No Abuse/Neglect Assessment (Assessment to be complete while patient is alone) Physical Abuse: Denies Verbal Abuse: Denies Sexual Abuse: Denies Exploitation of patient/patient's resources: Denies Self-Neglect: Denies Values / Beliefs Cultural Requests During Hospitalization: None Spiritual Requests During Hospitalization: None Consults Spiritual Care Consult Needed: No Social Work Consult Needed: No Regulatory affairs officer (For Healthcare) Does patient have an advance directive?: No    Additional Information 1:1 In Past 12 Months?: No CIRT Risk: No Elopement Risk: No Does patient have medical clearance?: Yes  Child/Adolescent Assessment Running Away Risk: Denies (Patient is an adult)  Disposition:  Disposition Initial Assessment Completed for this Encounter: Yes Disposition of Patient: Other dispositions (ER MD ordered Psych Consult) Other disposition(s): Other (Comment) (ER MD ordered Psych Consult )  On Site Evaluation by:   Reviewed with Physician:    Zachary Fusi MS, LCAS, [place], [place], CCSI Therapeutic Triage Specialist 06/21/2015 12:28 PM

## 2015-06-21 NOTE — Consult Note (Signed)
[facility] Consult   Reason for Consult:  Consult for 30 year old man who came to the hospital requesting detox Referring Physician:  Edd Fabian Patient Identification: Zachary Madden MRN:  [id number] Principal Diagnosis: Opiate withdrawal [facility]) Diagnosis:   Patient Active Problem List   Diagnosis Date Noted  . Opiate withdrawal ([place]) [F11.23] 06/21/2015  . Melanoma in situ of back ([place]) [D03.59] 01/01/2015  . GAD (generalized anxiety disorder) [F41.1] 02/20/2013  . Panic attacks [F41.0] 02/20/2013  . Opiate dependence ([place]) [F11.20] 02/19/2013  . Benzodiazepine dependence, continuous ([place]) [F13.20] 02/18/2013  . 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 ALLERGIC RHINITIS [J30.9] 09/14/2008    Total Time spent with patient: 1 hour  Subjective:   Zachary Madden is a 30 y.o. male patient admitted with "I have extreme anxiety and I'm self-medicating".  HPI:  Patient interviewed. Chart reviewed labs reviewed. This 30 year old man came voluntarily to the emergency room. He says that he's been feeling panicky for days. He hasn't been able to sleep for about 2 days. He feels nervous all over and is jittery. He is also having symptoms consistent with opiate withdrawal including general pain and nausea vomiting diarrhea. He says that he had been using Xanax as well as opiates including heroin but stopped it a couple days ago. He has been using Xanax about 4 mg a day chronically. Been using opiates several times a week but has not been injecting himself. Denies that he is using any other drugs. Denies that he is drinking regularly. Patient not currently getting any outpatient psychiatric treatment except through his primary care doctor. He says that he does take Lexapro which has been prescribed by his primary care doctor initially and he has now been referred to [facility]. Not on any other prescription medicine. Patient says his mood is nervous and anxious and he is requesting some help with  detox.  Social history: He is estranged from most of his family. Currently lives in a rented room. He seems to be looking to try to get back into some residential substance abuse treatment program.  Medical history: No significant ongoing medical problems outside of substance abuse.  Substance abuse history: Documented history of benzodiazepine and opiate abuse. Has been to detox programs and has been to some long-term treatment as well. He gives a somewhat unclear inconsistent history at one point saying that he been sober for a year until he relapsed a few weeks ago at other points talking about how he had relapsed a couple times over the last year. Does have familiarity with outpatient treatment.  Past Psychiatric History: Patient denies any history of suicide attempts. He has been to substance abuse treatment but no inpatient psychiatric otherwise. He is being prescribed appropriate medicine for anxiety. Sounds like he's gotten prescribed Klonopin in the past for anxiety but this is fed into his dependence.  Risk to Self: Suicidal Ideation: No Suicidal Intent: No Is patient at risk for suicide?: No Suicidal Plan?: No Access to Means: No What has been your use of drugs/alcohol within the last 12 months?: Heroin, Cannabis & Xanax  How many times?: 0 Other Self Harm Risks: Active Addiction Triggers for Past Attempts: None known Intentional Self Injurious Behavior: None Risk to Others: Homicidal Ideation: No Thoughts of Harm to Others: No Current Homicidal Intent: No Current Homicidal Plan: No Access to Homicidal Means: No Identified Victim: Reports of none History of harm to others?: No Assessment of Violence: None Noted Violent Behavior Description: Reports of none Does patient  have access to weapons?: No Criminal Charges Pending?: Yes Describe Pending Criminal Charges: Possession Drug Paraphernalia Does patient have a court date: Yes Court Date: 07/09/15 [facility]) Prior  Inpatient Therapy: Prior Inpatient Therapy: Yes Prior Therapy Dates: Winter 2016,  2015 & 2014 Prior Therapy Facilty/Provider(s): [facility] Reason for Treatment: Detox & SA Treatment Prior Outpatient Therapy: Prior Outpatient Therapy: No Prior Therapy Dates: Reports of none Prior Therapy Facilty/Provider(s): Reports of none Reason for Treatment: Reports of none Does patient have an ACCT team?: No Does patient have Intensive In-House Services?  : No Does patient have [facility] services? : No Does patient have P4CC services?: No  Past Medical History:  Past Medical History  Diagnosis Date  . Allergy   . Anxiety   . Cholelithiasis   . Mental disorder    No past surgical history on file. Family History:  Family History  Problem Relation Age of Onset  . Anxiety disorder Mother   . Chronic fatigue Mother   . Heart disease Father   . Diabetes Father    Family Psychiatric  History: Mother has anxiety and depression and substance abuse problems Social History:  History  Alcohol Use No    Comment: Patient denies     History  Drug Use  . Yes  . Special: Benzodiazepines, Heroin    Comment: Patient reports he uses benzos and heroin    Social History   Social History  . Marital Status: Divorced    Spouse Name: N/A  . Number of Children: N/A  . Years of Education: N/A   Social History Main Topics  . Smoking status: Former Research scientist (life sciences)  . Smokeless tobacco: Current User    Types: Snuff  . Alcohol Use: No     Comment: Patient denies  . Drug Use: Yes    Special: Benzodiazepines, Heroin     Comment: Patient reports he uses benzos and heroin  . Sexual Activity: Not on file   Other Topics Concern  . Not on file   Social History Narrative   Additional Social History:    Allergies:  No Known Allergies  Labs:  Results for orders placed or performed during the hospital encounter of 06/21/15 (from the past 48 hour(s))  CBC with Differential     Status: Abnormal    Collection Time: 06/21/15  9:30 AM  Result Value Ref Range   WBC 9.6 3.8 - 10.6 K/uL   RBC 4.94 4.40 - 5.90 MIL/uL   Hemoglobin 14.9 13.0 - 18.0 g/dL   HCT 41.8 40.0 - 52.0 %   MCV 84.5 80.0 - 100.0 fL   MCH 30.1 26.0 - 34.0 pg   MCHC 35.6 32.0 - 36.0 g/dL   RDW 13.3 11.5 - 14.5 %   Platelets 379 150 - 440 K/uL   Neutrophils Relative % 85% %   Neutro Abs 8.2 (H) 1.4 - 6.5 K/uL   Lymphocytes Relative 10% %   Lymphs Abs 1.0 1.0 - 3.6 K/uL   Monocytes Relative 5% %   Monocytes Absolute 0.5 0.2 - 1.0 K/uL   Eosinophils Relative 0% %   Eosinophils Absolute 0.0 0 - 0.7 K/uL   Basophils Relative 0% %   Basophils Absolute 0.0 0 - 0.1 K/uL  Comprehensive metabolic panel     Status: Abnormal   Collection Time: 06/21/15  9:30 AM  Result Value Ref Range   Sodium 138 135 - 145 mmol/L   Potassium 3.7 3.5 - 5.1 mmol/L   Chloride 102  101 - 111 mmol/L   CO2 23 22 - 32 mmol/L   Glucose, Bld 117 (H) 65 - 99 mg/dL   BUN 11 6 - 20 mg/dL   Creatinine, Ser 0.81 0.61 - 1.24 mg/dL   Calcium 9.2 8.9 - 10.3 mg/dL   Total Protein 8.0 6.5 - 8.1 g/dL   Albumin 4.5 3.5 - 5.0 g/dL   AST 23 15 - 41 U/L   ALT 17 17 - 63 U/L   Alkaline Phosphatase 77 38 - 126 U/L   Total Bilirubin 0.8 0.3 - 1.2 mg/dL   GFR calc non Af Amer >60 >60 mL/min   GFR calc Af Amer >60 >60 mL/min    Comment: (NOTE) The eGFR has been calculated using the CKD EPI equation. This calculation has not been validated in all clinical situations. eGFR's persistently <60 mL/min signify possible Chronic Kidney Disease.    Anion gap 13 5 - 15  Troponin I     Status: None   Collection Time: 06/21/15  9:30 AM  Result Value Ref Range   Troponin I <0.03 <0.031 ng/mL    Comment:        NO INDICATION OF MYOCARDIAL INJURY.   Fibrin derivatives D-Dimer ([facility] only)     Status: None   Collection Time: 06/21/15  9:30 AM  Result Value Ref Range   Fibrin derivatives D-dimer ([facility]) 128 0 - 499    Comment: <> Exclusion of Venous  Thromboembolism (VTE) - OUTPATIENTS ONLY        (Emergency Department or [place])             0-499 ng/ml (FEU)  : With a low to intermediate pretest                                        probability for VTE this test result                                        excludes the diagnosis of VTE.           > 499 ng/ml (FEU)  : VTE not excluded.  Additional work up                                   for VTE is required.   <>  Testing on Inpatients and Evaluation of Disseminated Intravascular        Coagulation (DIC)             Reference Range:   0-499 ng/ml (FEU)   Ethanol     Status: None   Collection Time: 06/21/15  9:30 AM  Result Value Ref Range   Alcohol, Ethyl (B) <5 <5 mg/dL    Comment:        LOWEST DETECTABLE LIMIT FOR SERUM ALCOHOL IS 5 mg/dL FOR MEDICAL PURPOSES ONLY   Salicylate level     Status: None   Collection Time: 06/21/15  9:30 AM  Result Value Ref Range   Salicylate Lvl <0.9 2.8 - 30.0 mg/dL  Acetaminophen level     Status: Abnormal   Collection Time: 06/21/15  9:30 AM  Result Value Ref Range   Acetaminophen (Tylenol), Serum <10 (L) 10 - 30 ug/mL  Comment:        THERAPEUTIC CONCENTRATIONS VARY SIGNIFICANTLY. A RANGE OF 10-30 ug/mL MAY BE AN EFFECTIVE CONCENTRATION FOR MANY PATIENTS. HOWEVER, SOME ARE BEST TREATED AT CONCENTRATIONS OUTSIDE THIS RANGE. ACETAMINOPHEN CONCENTRATIONS >150 ug/mL AT 4 HOURS AFTER INGESTION AND >50 ug/mL AT 12 HOURS AFTER INGESTION ARE OFTEN ASSOCIATED WITH TOXIC REACTIONS.     No current facility-administered medications for this encounter.   Current Outpatient Prescriptions  Medication Sig Dispense Refill  . escitalopram (LEXAPRO) 20 MG tablet Take 1 tablet (20 mg total) by mouth at bedtime. 100 tablet 3  . Multiple Vitamin (MULTIVITAMIN WITH MINERALS) TABS tablet Take 1 tablet by mouth daily. For low vitamin    . traMADol (ULTRAM) 50 MG tablet 1/2-1 tablet at bedtime when necessary for back pain 60 tablet 1     Musculoskeletal: Strength & Muscle Tone: within normal limits Gait & Station: normal Patient leans: N/A  Psychiatric Specialty Exam: Physical Exam  Nursing note and vitals reviewed. Constitutional: He appears well-developed and well-nourished.  HENT:  Head: Normocephalic and atraumatic.  Eyes: Conjunctivae are normal. Pupils are equal, round, and reactive to light.  Neck: Normal range of motion.  Cardiovascular: Normal heart sounds.   Respiratory: Effort normal.  GI: Soft.  Musculoskeletal: Normal range of motion.  Neurological: He is alert.  Generally tremulous  Skin: Skin is warm and dry.  Psychiatric: His speech is normal. Judgment and thought content normal. His mood appears anxious. He is agitated. Cognition and memory are normal.    Review of Systems  Constitutional: Negative.   HENT: Negative.   Eyes: Negative.   Respiratory: Negative.   Cardiovascular: Negative.   Gastrointestinal: Positive for nausea and diarrhea.  Musculoskeletal: Negative.   Skin: Negative.   Neurological: Positive for tremors.  Psychiatric/Behavioral: Positive for depression and substance abuse. Negative for suicidal ideas, hallucinations and memory loss. The patient is nervous/anxious and has insomnia.     Blood pressure 114/78, pulse 90, temperature 98.8 F (37.1 C), temperature source Axillary, resp. rate 20, height '5\' 5"'$  (1.651 m), weight 77.111 kg (170 lb), SpO2 96 %.Body mass index is 28.29 kg/(m^2).  General Appearance: Fairly Groomed  Eye Contact:  Fair  Speech:  Slurred  Volume:  Decreased  Mood:  Anxious  Affect:  Labile  Thought Process:  Goal Directed  Orientation:  Full (Time, Place, and Person)  Thought Content:  Logical  Suicidal Thoughts:  No  Homicidal Thoughts:  No  Memory:  Immediate;   Good Recent;   Fair Remote;   Fair  Judgement:  Fair  Insight:  Fair  Psychomotor Activity:  Decreased and Tremor  Concentration:  Concentration: Poor  Recall:  [employer] of  Knowledge:  Fair  Language:  Fair  Akathisia:  No  Handed:  Right  AIMS (if indicated):     Assets:  Communication Skills Desire for Improvement Physical Health Resilience  ADL's:  Intact  Cognition:  WNL  Sleep:        Treatment Plan Summary: Plan 30 year old man who has been abusing opiates and narcotics. Currently having typical symptoms of withdrawal. Vital signs are stable. He is tremulous but has not had a seizure. Medically seems to be stable. He's been able to hold down some food. Patient is not suicidal and does not require inpatient hospital treatment. He has been referred to residential treatment services and a bed is [facility] been obtained. He will be transferred to our TS at the earliest opportunity. He is voluntary. He will  then follow up with outpatient substance abuse treatment in the community.  Disposition: Patient does not meet criteria for psychiatric inpatient admission. Supportive therapy provided about ongoing stressors.  Alethia Berthold, MD 06/21/2015 3:42 PM

## 2015-06-22 NOTE — ED Notes (Signed)
Pt requesting more ativan, pt oriented to prior ativan given, no meds at this time, pt indicates understanding

## 2015-06-22 NOTE — ED Provider Notes (Signed)
Patient accepted by [facility] and a such will be discharged in the custody.  Gregor Hams, MD 06/22/15 [phone]

## 2015-06-22 NOTE — Discharge Instructions (Signed)
Polysubstance Abuse °When people abuse more than one drug or type of drug it is called polysubstance or polydrug abuse. For example, many smokers also drink alcohol. This is one form of polydrug abuse. Polydrug abuse also refers to the use of a drug to counteract an unpleasant effect produced by another drug. It may also be used to help with withdrawal from another drug. People who take stimulants may become agitated. Sometimes this agitation is countered with a tranquilizer. This helps protect against the unpleasant side effects. Polydrug abuse also refers to the use of different drugs at the same time.  °Anytime drug use is interfering with normal living activities, it has become abuse. This includes problems with family and friends. Psychological dependence has developed when your mind tells you that the drug is needed. This is usually followed by physical dependence which has developed when continuing increases of drug are required to get the same feeling or "high". This is known as addiction or chemical dependency. A person's risk is much higher if there is a history of chemical dependency in the family. °SIGNS OF CHEMICAL DEPENDENCY °· You have been told by friends or family that drugs have become a problem. °· You fight when using drugs. °· You are having blackouts (not remembering what you do while using). °· You feel sick from using drugs but continue using. °· You lie about use or amounts of drugs (chemicals) used. °· You need chemicals to get you going. °· You are suffering in work performance or in school because of drug use. °· You get sick from use of drugs but continue to use anyway. °· You need drugs to relate to people or feel comfortable in social situations. °· You use drugs to forget problems. °"Yes" answered to any of the above signs of chemical dependency indicates there are problems. The longer the use of drugs continues, the greater the problems will become. °If there is a family history of  drug or alcohol use, it is best not to experiment with these drugs. Continual use leads to tolerance. After tolerance develops more of the drug is needed to get the same feeling. This is followed by addiction. With addiction, drugs become the most important part of life. It becomes more important to take drugs than participate in the other usual activities of life. This includes relating to friends and family. Addiction is followed by dependency. Dependency is a condition where drugs are now needed not just to get high, but to feel normal. °Addiction cannot be cured but it can be stopped. This often requires outside help and the care of professionals. Treatment centers are listed in the yellow pages under: Cocaine, Narcotics, and Alcoholics Anonymous. Most hospitals and clinics can refer you to a specialized care center. Talk to your caregiver if you need help. °  °This information is not intended to replace advice given to you by your health care provider. Make sure you discuss any questions you have with your health care provider. °  °Document Released: 08/27/2004 Document Revised: 03/30/2011 Document Reviewed: 01/10/2014 °Elsevier Interactive Patient Education ©2016 [employer] ° °

## 2015-06-22 NOTE — ED Notes (Signed)
Pt requesting more ativan, Dr Owens Shark notified, no meds at this time, pt indicates understanding

## 2015-06-22 NOTE — ED Notes (Signed)
Pt requesting more ativan, Dr Karma Greaser notified, no meds at this time, pt indicates understanding

## 2015-10-14 ENCOUNTER — Ambulatory Visit: Payer: Self-pay

## 2015-10-24 ENCOUNTER — Ambulatory Visit: Payer: Self-pay

## 2016-04-01 ENCOUNTER — Encounter: Payer: Self-pay | Admitting: Emergency Medicine

## 2016-04-01 ENCOUNTER — Emergency Department
Admission: EM | Admit: 2016-04-01 | Discharge: 2016-04-01 | Disposition: A | Discharge status: Home / Self Care | Payer: Self-pay | Attending: Emergency Medicine | Admitting: Emergency Medicine

## 2016-04-01 DIAGNOSIS — F17290 Nicotine dependence, other tobacco product, uncomplicated: Secondary | ICD-10-CM | POA: Insufficient documentation

## 2016-04-01 DIAGNOSIS — L02414 Cutaneous abscess of left upper limb: Secondary | ICD-10-CM | POA: Insufficient documentation

## 2016-04-01 DIAGNOSIS — L0291 Cutaneous abscess, unspecified: Secondary | ICD-10-CM

## 2016-04-01 LAB — BASIC METABOLIC PANEL
ANION GAP: 7 (ref 5–15)
BUN: 9 mg/dL (ref 6–20)
CALCIUM: 9.2 mg/dL (ref 8.9–10.3)
CO2: 24 mmol/L (ref 22–32)
CREATININE: 0.81 mg/dL (ref 0.61–1.24)
Chloride: 107 mmol/L (ref 101–111)
Glucose, Bld: 108 mg/dL — ABNORMAL HIGH (ref 65–99)
Potassium: 4.2 mmol/L (ref 3.5–5.1)
SODIUM: 138 mmol/L (ref 135–145)

## 2016-04-01 LAB — CBC
HEMATOCRIT: 39.4 % — AB (ref 40.0–52.0)
Hemoglobin: 14 g/dL (ref 13.0–18.0)
MCH: 31 pg (ref 26.0–34.0)
MCHC: 35.5 g/dL (ref 32.0–36.0)
MCV: 87.2 fL (ref 80.0–100.0)
PLATELETS: 348 10*3/uL (ref 150–440)
RBC: 4.52 MIL/uL (ref 4.40–5.90)
RDW: 13.2 % (ref 11.5–14.5)
WBC: 10.3 10*3/uL (ref 3.8–10.6)

## 2016-04-01 MED ORDER — HYDROCODONE-ACETAMINOPHEN 5-325 MG PO TABS
1.0000 | ORAL_TABLET | Freq: Once | ORAL | Status: AC
  Administered 2016-04-01: 1 via ORAL

## 2016-04-01 MED ORDER — HYDROCODONE-ACETAMINOPHEN 5-325 MG PO TABS: 1.0000 | ORAL_TABLET | ORAL | 0 refills | Status: DC | PRN

## 2016-04-01 MED ORDER — HYDROCODONE-ACETAMINOPHEN 5-325 MG PO TABS
ORAL_TABLET | ORAL | Status: AC
  Filled 2016-04-01: qty 1

## 2016-04-01 MED ORDER — SULFAMETHOXAZOLE-TRIMETHOPRIM 800-160 MG PO TABS: 1.0000 | ORAL_TABLET | Freq: Two times a day (BID) | ORAL | 0 refills | Status: DC

## 2016-04-01 MED ORDER — LIDOCAINE HCL (PF) 1 % IJ SOLN
INTRAMUSCULAR | Status: AC
  Administered 2016-04-01: 13:00:00
  Filled 2016-04-01: qty 5

## 2016-04-01 NOTE — ED Provider Notes (Signed)
[facility] Emergency Department Provider Note   ____________________________________________    I have reviewed the triage vital signs and the nursing notes.   HISTORY  Chief Complaint Wound Infection     HPI Zachary Madden is a 31 y.o. male who presents with complaints of infection to his left arm. Patient reports over the last 5 days he has had swelling and redness to his left forearm. 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 He states it started as a bump and then got worse. No fevers or chills. No nausea or vomiting.   Past Medical History:  Diagnosis Date  . Allergy   . Anxiety   . Cholelithiasis   . Mental disorder     Patient Active Problem List   Diagnosis Date Noted  . Opiate withdrawal ([place]) 06/21/2015  . Melanoma in situ of back ([place]) 01/01/2015  . GAD (generalized anxiety disorder) 02/20/2013  . Panic attacks 02/20/2013  . Opiate dependence ([employer]) 02/19/2013  . Benzodiazepine dependence, continuous ([place]) 02/18/2013  . 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 ALLERGIC RHINITIS 09/14/2008    History reviewed. No pertinent surgical history.  Prior to Admission medications   Medication Sig Start Date End Date Taking? Authorizing Provider  escitalopram (LEXAPRO) 20 MG tablet Take 1 tablet (20 mg total) by mouth at bedtime. 01/01/15   Dorena Cookey, MD  HYDROcodone-acetaminophen (NORCO/VICODIN) 5-325 MG tablet Take 1 tablet by mouth every 4 (four) hours as needed for moderate pain. 04/01/16   Lavonia Drafts, MD  Multiple Vitamin (MULTIVITAMIN WITH MINERALS) TABS tablet Take 1 tablet by mouth daily. For low vitamin 02/20/13   Encarnacion Slates, NP  sulfamethoxazole-trimethoprim (BACTRIM DS,SEPTRA DS) 800-160 MG tablet Take 1 tablet by mouth 2 (two) times daily. 04/01/16   Lavonia Drafts, MD  traMADol Veatrice Bourbon) 50 MG tablet 1/2-1 tablet at bedtime when necessary for back pain 01/01/15   Dorena Cookey, MD      Allergies Patient has no known allergies.  Family History  Problem Relation Age of Onset  . Anxiety disorder Mother   . Chronic fatigue Mother   . Heart disease Father   . Diabetes Father   . Cancer Father     Cancer    Social History Social History  Substance Use Topics  . Smoking status: Former Research scientist (life sciences)  . Smokeless tobacco: Current User    Types: Snuff  . Alcohol use No     Comment: Patient denies    Review of Systems  Constitutional: No fever/chills     Gastrointestinal: No nausea, no vomiting.    Musculoskeletal: Left arm pain and swelling Skin: Redness left arm Neurological: Negative for numbness or tingling    ____________________________________________   PHYSICAL EXAM:  VITAL SIGNS: ED Triage Vitals [04/01/16 1042]  Enc Vitals Group     BP 117/75     Pulse Rate 93     Resp 18     Temp 97.8 F (36.6 C)     Temp Source Oral     SpO2 97 %     Weight 170 lb (77.1 kg)     Height      Head Circumference      Peak Flow      Pain Score 10     Pain Loc      Pain Edu?      Excl. in [place]?     Constitutional: Alert and oriented. Appears in pain Eyes: Conjunctivae are normal.  Nose: No congestion/rhinnorhea. Mouth/Throat: Mucous membranes are moist.   Cardiovascular: Normal rate, regular rhythm.  Respiratory: Normal respiratory effort.  No retractions. Genitourinary: deferred Musculoskeletal: Left arm, dorsal aspect with abscess approximately 3 x 3 cm with surrounding erythema. No drainage at this time. Neurologic:  Normal speech and language. No gross focal neurologic deficits are appreciated.   Skin:  Skin is warm, dry. See above   ____________________________________________   LABS (all labs ordered are listed, but only abnormal results are displayed)  Labs Reviewed  CBC - Abnormal; Notable for the following:       Result Value   HCT 39.4 (*)    All other components within normal limits  BASIC METABOLIC PANEL - Abnormal; Notable  for the following:    Glucose, Bld 108 (*)    All other components within normal limits   ____________________________________________  EKG   ____________________________________________  RADIOLOGY  None ____________________________________________   PROCEDURES  Procedure(s) performed: yes  INCISION AND DRAINAGE Performed by: Lavonia Drafts Consent: Verbal consent obtained. Risks and benefits: risks, benefits and alternatives were discussed Type: abscess  Body area: Left arm  Anesthesia: local infiltration  Incision was made with a scalpel.  Local anesthetic: lidocaine 1%   Anesthetic total: 4 ml  Complexity: complex Blunt dissection to break up loculations  Drainage: purulent  Drainage amount: Moderate   Packing material: 1/4 in iodoform gauze  Patient tolerance: Patient tolerated the procedure well with no immediate complications.       Critical Care performed: No ____________________________________________   INITIAL IMPRESSION / ASSESSMENT AND PLAN / ED COURSE  Pertinent labs & imaging results that were available during my care of the patient were reviewed by me and considered in my medical decision making (see chart for details).  Patient with clear abscess to the left arm with some mild surrounding erythema. Treated with I&D, Bactrim prescription, wound check in 2 days.   ____________________________________________   FINAL CLINICAL IMPRESSION(S) / ED DIAGNOSES  Final diagnoses:  Abscess      NEW MEDICATIONS STARTED DURING THIS VISIT:  Discharge Medication List as of 04/01/2016 12:46 PM    START taking these medications   Details  HYDROcodone-acetaminophen (NORCO/VICODIN) 5-325 MG tablet Take 1 tablet by mouth every 4 (four) hours as needed for moderate pain., Starting Wed 04/01/2016, Print    sulfamethoxazole-trimethoprim (BACTRIM DS,SEPTRA DS) 800-160 MG tablet Take 1 tablet by mouth 2 (two) times daily., Starting Wed  04/01/2016, Print         Note:  This document was prepared using Dragon voice recognition software and may include unintentional dictation errors.    Lavonia Drafts, MD 04/01/16 [phone]

## 2016-04-01 NOTE — ED Triage Notes (Signed)
Pt with wound infection to left arm for five days. Started out as a small bump and has gotten bigger and worse with pain. 

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
## 2016-09-02 ENCOUNTER — Observation Stay
Admission: EM | Admit: 2016-09-02 | Discharge: 2016-09-04 | Disposition: A | Discharge status: Home / Self Care | Payer: No Typology Code available for payment source | Attending: Internal Medicine | Admitting: Internal Medicine

## 2016-09-02 DIAGNOSIS — F1910 Other psychoactive substance abuse, uncomplicated: Secondary | ICD-10-CM

## 2016-09-02 DIAGNOSIS — F329 Major depressive disorder, single episode, unspecified: Secondary | ICD-10-CM | POA: Insufficient documentation

## 2016-09-02 DIAGNOSIS — T424X1A Poisoning by benzodiazepines, accidental (unintentional), initial encounter: Secondary | ICD-10-CM | POA: Diagnosis not present

## 2016-09-02 DIAGNOSIS — Y92481 Parking lot as the place of occurrence of the external cause: Secondary | ICD-10-CM | POA: Diagnosis not present

## 2016-09-02 DIAGNOSIS — Z8249 Family history of ischemic heart disease and other diseases of the circulatory system: Secondary | ICD-10-CM | POA: Insufficient documentation

## 2016-09-02 DIAGNOSIS — C4359 Malignant melanoma of other part of trunk: Secondary | ICD-10-CM | POA: Insufficient documentation

## 2016-09-02 DIAGNOSIS — Z818 Family history of other mental and behavioral disorders: Secondary | ICD-10-CM | POA: Diagnosis not present

## 2016-09-02 DIAGNOSIS — G92 Toxic encephalopathy: Secondary | ICD-10-CM | POA: Diagnosis not present

## 2016-09-02 DIAGNOSIS — F411 Generalized anxiety disorder: Secondary | ICD-10-CM | POA: Insufficient documentation

## 2016-09-02 DIAGNOSIS — Z79899 Other long term (current) drug therapy: Secondary | ICD-10-CM | POA: Insufficient documentation

## 2016-09-02 DIAGNOSIS — J309 Allergic rhinitis, unspecified: Secondary | ICD-10-CM | POA: Insufficient documentation

## 2016-09-02 DIAGNOSIS — R4182 Altered mental status, unspecified: Secondary | ICD-10-CM | POA: Diagnosis present

## 2016-09-02 DIAGNOSIS — Z833 Family history of diabetes mellitus: Secondary | ICD-10-CM | POA: Insufficient documentation

## 2016-09-02 DIAGNOSIS — Z809 Family history of malignant neoplasm, unspecified: Secondary | ICD-10-CM | POA: Diagnosis not present

## 2016-09-02 DIAGNOSIS — T401X1A Poisoning by heroin, accidental (unintentional), initial encounter: Secondary | ICD-10-CM | POA: Diagnosis not present

## 2016-09-02 DIAGNOSIS — F410 Panic disorder [episodic paroxysmal anxiety] without agoraphobia: Secondary | ICD-10-CM | POA: Insufficient documentation

## 2016-09-02 DIAGNOSIS — T40601S Poisoning by unspecified narcotics, accidental (unintentional), sequela: Secondary | ICD-10-CM | POA: Diagnosis present

## 2016-09-02 DIAGNOSIS — F1120 Opioid dependence, uncomplicated: Secondary | ICD-10-CM | POA: Diagnosis present

## 2016-09-02 DIAGNOSIS — F1320 Sedative, hypnotic or anxiolytic dependence, uncomplicated: Secondary | ICD-10-CM | POA: Diagnosis present

## 2016-09-02 DIAGNOSIS — R400 Somnolence: Secondary | ICD-10-CM | POA: Insufficient documentation

## 2016-09-02 DIAGNOSIS — Z720 Tobacco use: Secondary | ICD-10-CM | POA: Diagnosis not present

## 2016-09-02 LAB — COMPREHENSIVE METABOLIC PANEL
ALT: 25 U/L (ref 17–63)
AST: 22 U/L (ref 15–41)
Albumin: 4.1 g/dL (ref 3.5–5.0)
Alkaline Phosphatase: 64 U/L (ref 38–126)
Anion gap: 7 (ref 5–15)
BUN: 8 mg/dL (ref 6–20)
CHLORIDE: 107 mmol/L (ref 101–111)
CO2: 25 mmol/L (ref 22–32)
CREATININE: 0.73 mg/dL (ref 0.61–1.24)
Calcium: 8.8 mg/dL — ABNORMAL LOW (ref 8.9–10.3)
GFR calc Af Amer: >60 mL/min (ref >60)
GFR calc non Af Amer: >60 mL/min (ref >60)
Glucose, Bld: 95 mg/dL (ref 65–99)
POTASSIUM: 3.9 mmol/L (ref 3.5–5.1)
SODIUM: 139 mmol/L (ref 135–145)
Total Bilirubin: 0.6 mg/dL (ref 0.3–1.2)
Total Protein: 7 g/dL (ref 6.5–8.1)

## 2016-09-02 LAB — CBC
HCT: 36.2 % — ABNORMAL LOW (ref 40.0–52.0)
HEMOGLOBIN: 13.1 g/dL (ref 13.0–18.0)
MCH: 31.3 pg (ref 26.0–34.0)
MCHC: 36 g/dL (ref 32.0–36.0)
MCV: 86.9 fL (ref 80.0–100.0)
Platelets: 330 10*3/uL (ref 150–440)
RBC: 4.17 MIL/uL — AB (ref 4.40–5.90)
RDW: 12.4 % (ref 11.5–14.5)
WBC: 6.5 10*3/uL (ref 3.8–10.6)

## 2016-09-02 LAB — URINE DRUG SCREEN, QUALITATIVE (ARMC ONLY)
AMPHETAMINES, UR SCREEN: NOT DETECTED
Barbiturates, Ur Screen: NOT DETECTED
Benzodiazepine, Ur Scrn: POSITIVE — AB
CANNABINOID 50 NG, UR ~~LOC~~: NOT DETECTED
COCAINE METABOLITE, UR ~~LOC~~: NOT DETECTED
MDMA (ECSTASY) UR SCREEN: NOT DETECTED
Methadone Scn, Ur: NOT DETECTED
Opiate, Ur Screen: POSITIVE — AB
Phencyclidine (PCP) Ur S: NOT DETECTED
TRICYCLIC, UR SCREEN: NOT DETECTED

## 2016-09-02 LAB — SALICYLATE LEVEL

## 2016-09-02 LAB — ACETAMINOPHEN LEVEL: Acetaminophen (Tylenol), Serum: <10 ug/mL — ABNORMAL LOW (ref 10–30)

## 2016-09-02 LAB — ETHANOL: Alcohol, Ethyl (B): <5 mg/dL (ref <5)
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
## 2016-09-02 NOTE — ED Provider Notes (Signed)
Clinical Course as of Sep 03 240  Wed Sep 02, 2016  2144 Assuming care from Dr. Cherylann Banas.  In short, Zachary Madden is a 31 y.o. male with a chief complaint of drug overdose.  Refer to the original H&P for additional details.  The current plan of care is to observe for sobriety and watch his airway.  Has not required medications thus far and is protecting airway.   [CF]  Thu Sep 03, 2016  0016 Patient continues to sleep and protect airway.  Transferring care to Dr. Beather Arbour.  [CF]    Clinical Course User Index [CF] Hinda Kehr, MD      Hinda Kehr, MD 09/03/16 [phone]
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
## 2016-09-03 ENCOUNTER — Emergency Department: Payer: No Typology Code available for payment source

## 2016-09-03 ENCOUNTER — Encounter: Payer: Self-pay | Admitting: Internal Medicine

## 2016-09-03 DIAGNOSIS — T50901D Poisoning by unspecified drugs, medicaments and biological substances, accidental (unintentional), subsequent encounter: Secondary | ICD-10-CM

## 2016-09-03 DIAGNOSIS — T40601S Poisoning by unspecified narcotics, accidental (unintentional), sequela: Secondary | ICD-10-CM | POA: Diagnosis present

## 2016-09-03 LAB — CSF CELL COUNT WITH DIFFERENTIAL
RBC COUNT CSF: 0 /mm3 (ref 0–3)
RBC Count, CSF: 0 /mm3 (ref 0–3)
Tube #: 1
Tube #: 4
WBC, CSF: 0 /mm3 (ref 0–5)
WBC, CSF: 0 /mm3 (ref 0–5)

## 2016-09-03 LAB — BLOOD GAS, ARTERIAL
ACID-BASE EXCESS: 0.2 mmol/L (ref 0.0–2.0)
Bicarbonate: 26.9 mmol/L (ref 20.0–28.0)
FIO2: 0.28
O2 SAT: 95.6 %
PCO2 ART: 51 mmHg — AB (ref 32.0–48.0)
PH ART: 7.33 — AB (ref 7.350–7.450)
PO2 ART: 85 mmHg (ref 83.0–108.0)
Patient temperature: 37

## 2016-09-03 LAB — TSH: TSH: 0.295 u[IU]/mL — AB (ref 0.350–4.500)

## 2016-09-03 LAB — PROTEIN AND GLUCOSE, CSF
Glucose, CSF: 55 mg/dL (ref 40–70)
TOTAL PROTEIN, CSF: 28 mg/dL (ref 15–45)

## 2016-09-03 MED ORDER — ORAL CARE MOUTH RINSE
15.0000 mL | Freq: Two times a day (BID) | OROMUCOSAL | Status: DC
  Administered 2016-09-04: 11:00:00 15 mL via OROMUCOSAL

## 2016-09-03 MED ORDER — NALOXONE HCL 2 MG/2ML IJ SOSY
0.4000 mg | PREFILLED_SYRINGE | Freq: Once | INTRAMUSCULAR | Status: AC
  Administered 2016-09-03: 0.4 mg via INTRAVENOUS

## 2016-09-03 MED ORDER — POLYETHYLENE GLYCOL 3350 17 G PO PACK: 17.0000 g | PACK | Freq: Every day | ORAL | Status: DC | PRN

## 2016-09-03 MED ORDER — NALOXONE HCL 2 MG/2ML IJ SOSY
PREFILLED_SYRINGE | INTRAMUSCULAR | Status: AC
  Filled 2016-09-03: qty 2

## 2016-09-03 MED ORDER — LIDOCAINE HCL (PF) 1 % IJ SOLN
5.0000 mL | Freq: Once | INTRAMUSCULAR | Status: AC
  Administered 2016-09-03: 5 mL via INTRADERMAL

## 2016-09-03 MED ORDER — HALOPERIDOL LACTATE 5 MG/ML IJ SOLN: 1.0000 mg | Freq: Four times a day (QID) | INTRAMUSCULAR | Status: DC | PRN

## 2016-09-03 MED ORDER — ALBUTEROL SULFATE (2.5 MG/3ML) 0.083% IN NEBU: 2.5000 mg | INHALATION_SOLUTION | RESPIRATORY_TRACT | Status: DC | PRN

## 2016-09-03 MED ORDER — NALOXONE HCL 2 MG/2ML IJ SOSY
2.0000 mg | PREFILLED_SYRINGE | Freq: Once | INTRAMUSCULAR | Status: AC
  Administered 2016-09-03: 2 mg via INTRAVENOUS

## 2016-09-03 MED ORDER — LIDOCAINE HCL (PF) 1 % IJ SOLN
INTRAMUSCULAR | Status: AC
  Filled 2016-09-03: qty 5

## 2016-09-03 MED ORDER — ACETAMINOPHEN 325 MG PO TABS
650.0000 mg | ORAL_TABLET | Freq: Four times a day (QID) | ORAL | Status: DC | PRN
  Administered 2016-09-04: 650 mg via ORAL
  Filled 2016-09-03: qty 2

## 2016-09-03 MED ORDER — ACETAMINOPHEN 650 MG RE SUPP: 650.0000 mg | Freq: Four times a day (QID) | RECTAL | Status: DC | PRN

## 2016-09-03 MED ORDER — SODIUM CHLORIDE 0.9% FLUSH
3.0000 mL | Freq: Two times a day (BID) | INTRAVENOUS | Status: DC
  Administered 2016-09-03 – 2016-09-04 (×2): 3 mL via INTRAVENOUS

## 2016-09-03 MED ORDER — ONDANSETRON HCL 4 MG PO TABS: 4.0000 mg | ORAL_TABLET | Freq: Four times a day (QID) | ORAL | Status: DC | PRN

## 2016-09-03 MED ORDER — SODIUM CHLORIDE 0.9 % IV BOLUS (SEPSIS)
1000.0000 mL | Freq: Once | INTRAVENOUS | Status: AC
  Administered 2016-09-03: 1000 mL via INTRAVENOUS

## 2016-09-03 MED ORDER — ONDANSETRON HCL 4 MG/2ML IJ SOLN: 4.0000 mg | Freq: Four times a day (QID) | INTRAMUSCULAR | Status: DC | PRN

## 2016-09-03 MED ORDER — ENOXAPARIN SODIUM 40 MG/0.4ML ~~LOC~~ SOLN: 40.0000 mg | SUBCUTANEOUS | Status: DC

## 2016-09-03 NOTE — ED Notes (Signed)
Pt moved to RM 5 for better visualization, per Beather Arbour, MD.

## 2016-09-03 NOTE — ED Notes (Signed)
4 Score coma scale: 14  Eye 2 Motor 4 Brainstem 4 Respirations 4

## 2016-09-03 NOTE — ED Notes (Signed)
Passing pt room, noticed pt was pale/dusky in color, respirations 6, BP 84/59, HR 44. Pt had white foam at mouth. Pt cuctioned, pt responded to suction by moving body/head and limbs. MD and RN made aware. See MAR for intervention.

## 2016-09-03 NOTE — ED Notes (Signed)
MD at bedside. 

## 2016-09-03 NOTE — ED Notes (Signed)
Attempting to call report at this time. 

## 2016-09-03 NOTE — Progress Notes (Signed)
Unable to complete full admission profile at this time due to pt's lethargy, confusion and inability to fully participate. Will continue to monitor and attempt to complete when patient is alert and more oriented. Sitter at bedside for safety due to patient being impulsive and repeatedly trying to get up when awake.
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
## 2016-09-03 NOTE — ED Provider Notes (Addendum)
The patient is somnolent breathing 8 breaths a minute with a heart rate in the high 40s. He is arousable although falls back asleep immediately. Pupils are extremely dilated. We've received conflicting reports as to the efficacy of the locks on last night. Pupils are not consistent with opioid overdose however his toxidrome is. I'll give him another trial of meloxicam now and reevaluate. He may very well be washing out from amphetamines.  ----------------------------------------- 7:58 AM on 09/03/2016 -----------------------------------------  The patient did not respond 2 mg of naloxone. At this point he has been asleep for over 15 hours and is difficult to arouse. I appreciate that he is afebrile but I'm concerned he may have meningitis. Lumbar puncture is pending.   LUMBAR PUNCTURE  Date/Time: 09/03/2016 at 8:22 AM Performed by: Darel Hong  Consent: Implied consent given altered mental status  Relevant documents: relevant documents present and verified  Test results: test results available and properly labeled Site marked: the operative site was marked Imaging studies: imaging studies available  Required items: required blood products, implants, devices, and special equipment available  Patient identity confirmed:  arm band  Time out: Immediately prior to procedure a "time out" was called to verify the correct patient, procedure, equipment, support staff and site/side marked as required.  Indications: Altered mental status concern for meningitis  Anesthesia: local infiltration Local anesthetic: lidocaine 1% without epinephrine Anesthetic total: 3 ml Patient sedated: No  Analgesia: Local  Preparation: Patient was prepped and draped in the usual sterile fashion. Lumbar space: L3-L4 interspace Patient's position: left lateral decubitus Needle gauge: 18 Needle length: 3.5 in Number of attempts: 1 Opening pressure: Not measured cm H2O Fluid appearance: Clear Tubes of fluid:  4 Total volume: 6 ml Post-procedure: site cleaned and adhesive bandage applied Patient tolerance: Patient tolerated the procedure well with no immediate complications  Cerebral spinal fluid was clear and not brisk. Will be sent off to the lab for appropriate testing. Patient tolerated the procedure well.    Darel Hong, MD 09/03/16 [id number]    Darel Hong, MD 09/03/16 [phone]   The patient's lumbar puncture shows no evidence of meningitis. He has been the emergency department for 16-1/2 hours and is still somnolent and difficult to arouse. At this point he requires inpatient admission for further workup of his protracted altered mental status.   Darel Hong, MD 09/03/16 [phone]

## 2016-09-03 NOTE — BH Assessment (Signed)
  Dr.Clapacs aware of consult request.

## 2016-09-03 NOTE — ED Notes (Signed)
Patient more awake at this time. Patient admits to using xanax and heroin yesterday. Patient notified that his father had attempted to call to get information. Patient does not want his father contacted at this time.

## 2016-09-03 NOTE — ED Notes (Signed)
Respiratory therapy notified for arterial blood gas.

## 2016-09-03 NOTE — ED Notes (Signed)
Pt showed no reaction/reversal to medication (Narcan).

## 2016-09-03 NOTE — ED Notes (Signed)
No response to Narcan 

## 2016-09-03 NOTE — ED Notes (Signed)
CSF specimens walked down to lab by Joellen Jersey, Therapist, sports.

## 2016-09-03 NOTE — ED Notes (Addendum)
Trialed patient off of oxygen. SpO2 92%. Patient placed back on 2L [place].
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
## 2016-09-03 NOTE — ED Notes (Signed)
Dr. Mable Paris at bedside for LP. Assisted by this RN. Patient tolerated well.

## 2016-09-03 NOTE — ED Notes (Signed)
Patient transported to CT 

## 2016-09-03 NOTE — ED Notes (Signed)
Dr. Darvin Neighbours notified that father has attempted to call for update. Staff are not able to get consent from patient at this time, if he would like for [place] to give medical information so patients father phone number has been left.  Glean Salen: [phone]
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
## 2016-09-04 LAB — COMPREHENSIVE METABOLIC PANEL
ALK PHOS: 70 U/L (ref 38–126)
ALT: 21 U/L (ref 17–63)
AST: 25 U/L (ref 15–41)
Albumin: 3.7 g/dL (ref 3.5–5.0)
Anion gap: 5 (ref 5–15)
BUN: 20 mg/dL (ref 6–20)
CHLORIDE: 110 mmol/L (ref 101–111)
CO2: 28 mmol/L (ref 22–32)
CREATININE: 0.85 mg/dL (ref 0.61–1.24)
Calcium: 8.8 mg/dL — ABNORMAL LOW (ref 8.9–10.3)
GFR calc Af Amer: >60 mL/min (ref >60)
Glucose, Bld: 119 mg/dL — ABNORMAL HIGH (ref 65–99)
Potassium: 4.1 mmol/L (ref 3.5–5.1)
Sodium: 143 mmol/L (ref 135–145)
Total Bilirubin: 0.4 mg/dL (ref 0.3–1.2)
Total Protein: 6.8 g/dL (ref 6.5–8.1)

## 2016-09-04 LAB — CBC
HEMATOCRIT: 38.5 % — AB (ref 40.0–52.0)
HEMOGLOBIN: 13.7 g/dL (ref 13.0–18.0)
MCH: 30.7 pg (ref 26.0–34.0)
MCHC: 35.5 g/dL (ref 32.0–36.0)
MCV: 86.4 fL (ref 80.0–100.0)
PLATELETS: 336 10*3/uL (ref 150–440)
RBC: 4.46 MIL/uL (ref 4.40–5.90)
RDW: 12.4 % (ref 11.5–14.5)
WBC: 9.4 10*3/uL (ref 3.8–10.6)

## 2016-09-04 LAB — AMMONIA: AMMONIA: 19 umol/L (ref 9–35)

## 2016-09-04 LAB — HIV ANTIBODY (ROUTINE TESTING W REFLEX): HIV SCREEN 4TH GENERATION: NONREACTIVE

## 2016-09-04 NOTE — Discharge Instructions (Signed)
Resume diet and activity as before  Please f/u with your Psychiatrist at [facility]

## 2016-09-04 NOTE — Progress Notes (Signed)
Patient discharged home per MD order. All discharge instructions given and all questions answered. 

## 2016-09-06 LAB — CSF CULTURE
CULTURE: NO GROWTH
GRAM STAIN: NONE SEEN

## 2016-09-06 LAB — CSF CULTURE W GRAM STAIN
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
## 2016-12-18 ENCOUNTER — Encounter: Payer: Self-pay | Admitting: *Deleted

## 2016-12-18 ENCOUNTER — Other Ambulatory Visit: Payer: Self-pay

## 2016-12-18 ENCOUNTER — Emergency Department
Admission: EM | Admit: 2016-12-18 | Discharge: 2016-12-18 | Disposition: A | Discharge status: Home / Self Care | Payer: Self-pay | Attending: Emergency Medicine | Admitting: Emergency Medicine

## 2016-12-18 ENCOUNTER — Emergency Department
Admission: EM | Admit: 2016-12-18 | Discharge: 2016-12-18 | Discharge status: Against Medical Advice | Payer: Self-pay | Attending: Emergency Medicine | Admitting: Emergency Medicine

## 2016-12-18 DIAGNOSIS — T40601S Poisoning by unspecified narcotics, accidental (unintentional), sequela: Secondary | ICD-10-CM

## 2016-12-18 DIAGNOSIS — F1110 Opioid abuse, uncomplicated: Secondary | ICD-10-CM | POA: Insufficient documentation

## 2016-12-18 DIAGNOSIS — F411 Generalized anxiety disorder: Secondary | ICD-10-CM | POA: Diagnosis present

## 2016-12-18 DIAGNOSIS — F99 Mental disorder, not otherwise specified: Secondary | ICD-10-CM | POA: Insufficient documentation

## 2016-12-18 DIAGNOSIS — F17220 Nicotine dependence, chewing tobacco, uncomplicated: Secondary | ICD-10-CM | POA: Insufficient documentation

## 2016-12-18 DIAGNOSIS — Z5320 Procedure and treatment not carried out because of patient's decision for unspecified reasons: Secondary | ICD-10-CM | POA: Insufficient documentation

## 2016-12-18 HISTORY — DX: Sciatica, unspecified side: M54.30

## 2016-12-18 LAB — COMPREHENSIVE METABOLIC PANEL
ALBUMIN: 4 g/dL (ref 3.5–5.0)
ALT: 22 U/L (ref 17–63)
AST: 32 U/L (ref 15–41)
Alkaline Phosphatase: 69 U/L (ref 38–126)
Anion gap: 10 (ref 5–15)
BUN: 9 mg/dL (ref 6–20)
CHLORIDE: 108 mmol/L (ref 101–111)
CO2: 24 mmol/L (ref 22–32)
Calcium: 8.8 mg/dL — ABNORMAL LOW (ref 8.9–10.3)
Creatinine, Ser: 0.71 mg/dL (ref 0.61–1.24)
GFR calc Af Amer: >60 mL/min (ref >60)
GFR calc non Af Amer: >60 mL/min (ref >60)
GLUCOSE: 103 mg/dL — AB (ref 65–99)
POTASSIUM: 3.9 mmol/L (ref 3.5–5.1)
SODIUM: 142 mmol/L (ref 135–145)
TOTAL PROTEIN: 7.3 g/dL (ref 6.5–8.1)
Total Bilirubin: 0.6 mg/dL (ref 0.3–1.2)

## 2016-12-18 LAB — CBC
HEMATOCRIT: 39.6 % — AB (ref 40.0–52.0)
HEMOGLOBIN: 13.7 g/dL (ref 13.0–18.0)
MCH: 30.3 pg (ref 26.0–34.0)
MCHC: 34.5 g/dL (ref 32.0–36.0)
MCV: 87.7 fL (ref 80.0–100.0)
Platelets: 417 10*3/uL (ref 150–440)
RBC: 4.52 MIL/uL (ref 4.40–5.90)
RDW: 13 % (ref 11.5–14.5)
WBC: 11.3 10*3/uL — ABNORMAL HIGH (ref 3.8–10.6)

## 2016-12-18 LAB — ETHANOL: Alcohol, Ethyl (B): <10 mg/dL (ref <10)

## 2016-12-18 MED ORDER — NALOXONE HCL 4 MG/0.1ML NA LIQD
1.0000 | Freq: Once | NASAL | Status: AC
  Administered 2016-12-18: 1 via NASAL
  Filled 2016-12-18: qty 4

## 2016-12-18 NOTE — ED Notes (Signed)
Dr.Clapacs at bedside  

## 2016-12-18 NOTE — Consult Note (Signed)
[facility] Consult   Reason for Consult: Consult for 31 year old man with a history of substance abuse who was brought to the emergency room after being rescued from an overdose Referring Physician: Quale Patient Identification: Zachary Madden MRN:  [id number] Principal Diagnosis: Opiate abuse, episodic [facility]) Diagnosis:   Patient Active Problem List   Diagnosis Date Noted  . Opiate abuse, episodic ([place]) [F11.10] 12/18/2016  . Overdose opiate, accidental or unintentional, sequela [T40.601S] 09/03/2016  . Opiate withdrawal ([place]) [F11.23] 06/21/2015  . Melanoma in situ of back ([place]) [D03.59] 01/01/2015  . GAD (generalized anxiety disorder) [F41.1] 02/20/2013  . Panic attacks [F41.0] 02/20/2013  . Opiate dependence ([place]) [F11.20] 02/19/2013  . Benzodiazepine dependence, continuous ([place]) [F13.20] 02/18/2013  . 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 ALLERGIC RHINITIS [J30.9] 09/14/2008    Total Time spent with patient: 1 hour  Subjective:   Zachary Madden is a 31 y.o. male patient admitted with "I just need to get out of here".  HPI: Patient interviewed chart reviewed.  31 year old man with a history of substance abuse mostly opiates.  Apparently first responders were called to his job site today because he passed out from what seems to be an overdose of opiates.  They brought him into the emergency room and he was allowed to walk out [place] but then it became apparent that O commitment papers had been filed so I was asked to see him.  Patient was able to tell me that he had been sober from abusing opiates for about 92 days.  Today at work he spoke with a coworker who was able to obtain some heroin.  He shot up this morning and evidently lost consciousness.  Not clear how close he came to overdosing.  The patient is very clear however that there was no intention on his part to harm or kill himself.  He does tell me that he took about 3 mg of clonazepam  yesterday and had not been on any of that medicine at all recently which probably potentiated the effect of the heroin.  Patient does have chronic anxiety problems and chronic mild depression.  Energy level had been adequate.  Sleep normal.  No suicidal thoughts at all.  No psychosis.  He is by his report compliant with the prescribed antidepressant he takes from [place].  He has chronic financial stress as well as worries about his relationship with his girlfriend and difficulty just taking care of things day to day but no specific new problem that would have triggered today's relapse.  Medical history: He really does not have any significant ongoing medical problems apart from the substance abuse issues.  Social history: Patient had previously been living in an [place] but is no longer there and is now sharing a apartment with a person he knows through 12-step groups.  He is afraid that he is likely to of lost his job based on today's events.  Substance abuse history: Long-standing history of opiate abuse mostly heroin.  At times nasal at times intravenous.  Patient has engaged himself in 12-step groups and tried to be active and staying sober and has been able to maintain some sobriety for up to months at a time.  He does not see himself as having an abuse problem with benzodiazepines although obviously he is not averse to taking them if he can  Past Psychiatric History: Patient has been seen previously under similar circumstances.  He has no history of actually trying to  kill himself in the past.  He does report having a chronic anxiety disorder which she has only found relief for by taking clonazepam.  He is currently going to [place] and is on medication.  Risk to Self: Is patient at risk for suicide?: No Risk to Others:   Prior Inpatient Therapy:   Prior Outpatient Therapy:    Past Medical History:  Past Medical History:  Diagnosis Date  . Allergy   . Anxiety   . Cholelithiasis   . Mental  disorder   . Sciatica    History reviewed. No pertinent surgical history. Family History:  Family History  Problem Relation Age of Onset  . Anxiety disorder Mother   . Chronic fatigue Mother   . Heart disease Father   . Diabetes Father   . Cancer Father        Cancer   Family Psychiatric  History: None reported Social History:  Social History   Substance and Sexual Activity  Alcohol Use No   Comment: Patient denies     Social History   Substance and Sexual Activity  Drug Use Yes  . Types: Benzodiazepines, Heroin   Comment: Patient reports he uses benzos and heroin    Social History   Socioeconomic History  . Marital status: Divorced    Spouse name: None  . Number of children: None  . Years of education: None  . Highest education level: None  Social Needs  . Financial resource strain: None  . Food insecurity - worry: None  . Food insecurity - inability: None  . Transportation needs - medical: None  . Transportation needs - non-medical: None  Occupational History  . None  Tobacco Use  . Smoking status: Former Research scientist (life sciences)  . Smokeless tobacco: Current User    Types: Snuff  Substance and Sexual Activity  . Alcohol use: No    Comment: Patient denies  . Drug use: Yes    Types: Benzodiazepines, Heroin    Comment: Patient reports he uses benzos and heroin  . Sexual activity: None  Other Topics Concern  . None  Social History Narrative  . None   Additional Social History:    Allergies:  No Known Allergies  Labs:  Results for orders placed or performed during the hospital encounter of 12/18/16 (from the past 48 hour(s))  Comprehensive metabolic panel     Status: Abnormal   Collection Time: 12/18/16 12:59 PM  Result Value Ref Range   Sodium 142 135 - 145 mmol/L   Potassium 3.9 3.5 - 5.1 mmol/L   Chloride 108 101 - 111 mmol/L   CO2 24 22 - 32 mmol/L   Glucose, Bld 103 (H) 65 - 99 mg/dL   BUN 9 6 - 20 mg/dL   Creatinine, Ser 0.71 0.61 - 1.24 mg/dL   Calcium  8.8 (L) 8.9 - 10.3 mg/dL   Total Protein 7.3 6.5 - 8.1 g/dL   Albumin 4.0 3.5 - 5.0 g/dL   AST 32 15 - 41 U/L   ALT 22 17 - 63 U/L   Alkaline Phosphatase 69 38 - 126 U/L   Total Bilirubin 0.6 0.3 - 1.2 mg/dL   GFR calc non Af Amer >60 >60 mL/min   GFR calc Af Amer >60 >60 mL/min    Comment: (NOTE) The eGFR has been calculated using the CKD EPI equation. This calculation has not been validated in all clinical situations. eGFR's persistently <60 mL/min signify possible Chronic Kidney Disease.    Anion gap 10 5 -  15  Ethanol     Status: None   Collection Time: 12/18/16 12:59 PM  Result Value Ref Range   Alcohol, Ethyl (B) <10 <10 mg/dL    Comment:        LOWEST DETECTABLE LIMIT FOR SERUM ALCOHOL IS 10 mg/dL FOR MEDICAL PURPOSES ONLY   cbc     Status: Abnormal   Collection Time: 12/18/16 12:59 PM  Result Value Ref Range   WBC 11.3 (H) 3.8 - 10.6 K/uL   RBC 4.52 4.40 - 5.90 MIL/uL   Hemoglobin 13.7 13.0 - 18.0 g/dL   HCT 39.6 (L) 40.0 - 52.0 %   MCV 87.7 80.0 - 100.0 fL   MCH 30.3 26.0 - 34.0 pg   MCHC 34.5 32.0 - 36.0 g/dL   RDW 13.0 11.5 - 14.5 %   Platelets 417 150 - 440 K/uL    No current facility-administered medications for this encounter.    Current Outpatient Medications  Medication Sig Dispense Refill  . escitalopram (LEXAPRO) 20 MG tablet Take 1 tablet (20 mg total) by mouth at bedtime. (Patient not taking: Reported on 09/03/2016) 100 tablet 3  . Multiple Vitamin (MULTIVITAMIN WITH MINERALS) TABS tablet Take 1 tablet by mouth daily. For low vitamin (Patient not taking: Reported on 09/03/2016)      Musculoskeletal: Strength & Muscle Tone: within normal limits Gait & Station: normal Patient leans: N/A  Psychiatric Specialty Exam: Physical Exam  Nursing note and vitals reviewed. Constitutional: He appears well-developed and well-nourished.  HENT:  Head: Normocephalic and atraumatic.  Eyes: Conjunctivae are normal. Pupils are equal, round, and reactive to  light.  Neck: Normal range of motion.  Cardiovascular: Regular rhythm and normal heart sounds.  Respiratory: Effort normal. No respiratory distress.  GI: Soft.  Musculoskeletal: Normal range of motion.  Neurological: He is alert.  Skin: Skin is warm and dry.  Psychiatric: Judgment normal. His mood appears anxious. His speech is delayed. He is slowed. Thought content is not paranoid. He expresses no homicidal and no suicidal ideation. He exhibits abnormal recent memory.    Review of Systems  Constitutional: Negative.   HENT: Negative.   Eyes: Negative.   Respiratory: Negative.   Cardiovascular: Negative.   Gastrointestinal: Negative.   Musculoskeletal: Negative.   Skin: Negative.   Neurological: Negative.   Psychiatric/Behavioral: Positive for memory loss and substance abuse. Negative for depression, hallucinations and suicidal ideas. The patient is nervous/anxious. The patient does not have insomnia.     Blood pressure 106/74, pulse (!) 106, temperature 98.5 F (36.9 C), temperature source Oral, resp. rate 16, height '5\' 6"'$  (1.676 m), weight 79.8 kg (176 lb), SpO2 94 %.Body mass index is 28.41 kg/m.  General Appearance: Casual  Eye Contact:  Good  Speech:  Slow  Volume:  Decreased  Mood:  Anxious and Dysphoric  Affect:  Congruent  Thought Process:  Goal Directed  Orientation:  Full (Time, Place, and Person)  Thought Content:  Logical  Suicidal Thoughts:  No  Homicidal Thoughts:  No  Memory:  Immediate;   Good Recent;   Fair Remote;   Fair  Judgement:  Fair  Insight:  Fair  Psychomotor Activity:  Normal  Concentration:  Concentration: Fair  Recall:  [employer] of Knowledge:  Fair  Language:  Fair  Akathisia:  No  Handed:  Right  AIMS (if indicated):     Assets:  Communication Skills Desire for Improvement Housing Physical Health Resilience  ADL's:  Intact  Cognition:  WNL  Sleep:  Treatment Plan Summary: Plan 31 year old man with opiate abuse relapse  today.  No evidence that he was actually trying to harm himself.  He had been off of heroin for months which would have diminished his prior tolerance and also had taken some clonazepam last night which may have potentiated the effect.  Patient is now waking up and sobering up.  He is not delirious.  He has a chronic anxiety problem but is not psychotic and not suicidal.  Patient does not meet commitment criteria.  He is engaged in appropriate treatment really as best as he can under the current circumstances.  Case reviewed with emergency room physicians.  Discontinue IVC.  Encourage patient in his continued attempts to take care of his illness while empathizing with the difficult situation he is in.  No new prescriptions.  Disposition: Patient does not meet criteria for psychiatric inpatient admission. Supportive therapy provided about ongoing stressors.  Alethia Berthold, MD 12/18/2016 3:36 PM

## 2016-12-18 NOTE — ED Provider Notes (Signed)
[facility] Emergency Department Provider Note   ____________________________________________   First MD Initiated Contact with Patient 12/18/16 1424     (approximate)  I have reviewed the triage vital signs and the nursing notes.   HISTORY  Chief Complaint IVC  Substance abuse HPI Zachary Madden is a 31 y.o. male here for evaluation of substance abuse.  Patient was placed under involuntary commitment as he was seen earlier in the ED very briefly and left [place].  He is now back under IVC.  He reports that he was shooting heroin and also taking Klonopin last night.  He has a long history of heroin abuse and has attempting to get clean but was shooting up today at work.  He does not recall the events of that he knows he was using heroin and then the police were present at EMS and brought him here.  Denies any desire to harm himself or anyone else.  Denies any trouble breathing.  No chest pain.  He reports that he would like to be able to go soon, but would like something to eat as well.   Past Medical History:  Diagnosis Date  . Allergy   . Anxiety   . Cholelithiasis   . Mental disorder   . Sciatica     Patient Active Problem List   Diagnosis Date Noted  . Opiate abuse, episodic ([place]) 12/18/2016  . Overdose opiate, accidental or unintentional, sequela 09/03/2016  . Opiate withdrawal ([place]) 06/21/2015  . Melanoma in situ of back ([place]) 01/01/2015  . GAD (generalized anxiety disorder) 02/20/2013  . Panic attacks 02/20/2013  . Opiate dependence ([place]) 02/19/2013  . Benzodiazepine dependence, continuous ([place]) 02/18/2013  . 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 ALLERGIC RHINITIS 09/14/2008    History reviewed. No pertinent surgical history.  Prior to Admission medications   Medication Sig Start Date End Date Taking? Authorizing Provider  escitalopram (LEXAPRO) 20 MG tablet Take 1 tablet (20 mg total) by mouth at bedtime. Patient not  taking: Reported on 09/03/2016 01/01/15   Dorena Cookey, MD  Multiple Vitamin (MULTIVITAMIN WITH MINERALS) TABS tablet Take 1 tablet by mouth daily. For low vitamin Patient not taking: Reported on 09/03/2016 02/20/13   Lindell Spar I, NP    Allergies Patient has no known allergies.  Family History  Problem Relation Age of Onset  . Anxiety disorder Mother   . Chronic fatigue Mother   . Heart disease Father   . Diabetes Father   . Cancer Father        Cancer    Social History Social History   Tobacco Use  . Smoking status: Former Research scientist (life sciences)  . Smokeless tobacco: Current User    Types: Snuff  Substance Use Topics  . Alcohol use: No    Comment: Patient denies  . Drug use: Yes    Types: Benzodiazepines, Heroin    Comment: Patient reports he uses benzos and heroin    Review of Systems Constitutional: No fever/chills Eyes: No visual changes. ENT: No sore throat.  No trouble breathing. Cardiovascular: Denies chest pain. Respiratory: Denies shortness of breath. Gastrointestinal: No abdominal pain.    Skin: Negative for rash. Neurological: Negative for headaches    ____________________________________________   PHYSICAL EXAM:  VITAL SIGNS: ED Triage Vitals [12/18/16 1301]  Enc Vitals Group     BP 106/74     Pulse Rate (!) 106     Resp 16     Temp 98.5 F (36.9 C)  Temp Source Oral     SpO2 94 %     Weight 176 lb (79.8 kg)     Height '5\' 6"'$  (1.676 m)     Head Circumference      Peak Flow      Pain Score      Pain Loc      Pain Edu?      Excl. in [place]?     Constitutional: Alert and oriented. Well appearing and in no acute distress.  Slightly somnolent, but sitting up and able to converse with me. Eyes: Conjunctivae are normal. Head: Atraumatic. Nose: No congestion/rhinnorhea. Mouth/Throat: Mucous membranes are moist. Neck: No stridor.   Cardiovascular: Normal rate, regular rhythm. Grossly normal heart sounds.  Good peripheral circulation. Respiratory: Normal  respiratory effort.  No retractions. Lungs CTAB. Gastrointestinal: Soft and nontender. No distention. Musculoskeletal: No lower extremity tenderness nor edema. Neurologic:  Normal speech and language. No gross focal neurologic deficits are appreciated.  Skin:  Skin is warm, dry and intact. No rash noted. Psychiatric: Mood and affect are normal to slightly flat. Speech and behavior are normal.  Denies suicidal ideation.  Ports he was recreationally using he is a heroin addict.  ____________________________________________   LABS (all labs ordered are listed, but only abnormal results are displayed)  Labs Reviewed  COMPREHENSIVE METABOLIC PANEL - Abnormal; Notable for the following components:      Result Value   Glucose, Bld 103 (*)    Calcium 8.8 (*)    All other components within normal limits  CBC - Abnormal; Notable for the following components:   WBC 11.3 (*)    HCT 39.6 (*)    All other components within normal limits  ETHANOL   ____________________________________________  EKG   ____________________________________________  RADIOLOGY   ____________________________________________   PROCEDURES  Procedure(s) performed: None  Procedures  Critical Care performed: No  ____________________________________________   INITIAL IMPRESSION / ASSESSMENT AND PLAN / ED COURSE  Pertinent labs & imaging results that were available during my care of the patient were reviewed by me and considered in my medical decision making (see chart for details).  Patient returns, now under IVC.  Recently left AMA.  He self reports recreational heroin use and appears to had a overdose or near over the stay that did not require naloxone for reversal.  [place] pleased to report however that he required brief CPR, but EMS reported no naloxone had to be administered.  At this point, he does not have any evidence of delusions or hallucinations.  He freely admits to being a heroin abuser.   He has outpatient resources, and has been seen and cleared for discharge by Dr. Weber Cooks from psychiatry.  I have given him a prescription for naloxone kit also advised him on its safe use and strongly advised him in discontinuation of use and abuse of any medication including heroin.  Patient alert, ambulatory, in no distress having eaten a meal and normal vital signs.  Patient will be released and plans to call friends to drive him home      ____________________________________________   FINAL CLINICAL IMPRESSION(S) / ED DIAGNOSES  Final diagnoses:  Heroin abuse ([place])      NEW MEDICATIONS STARTED DURING THIS VISIT:  This SmartLink is deprecated. Use AVSMEDLIST instead to display the medication list for a patient.   Note:  This document was prepared using Dragon voice recognition software and may include unintentional dictation errors.     Delman Kitten, MD 12/18/16 [phone]

## 2016-12-18 NOTE — ED Notes (Signed)
Pt given food and drink (per Dr. Loyal Gambler.) Pt eating with no issues. Staying awake. Reports feels much better

## 2016-12-18 NOTE — ED Notes (Signed)
Pt refusing to stop at desk prior to discharge to sign out. Pt walked out

## 2016-12-18 NOTE — ED Notes (Signed)
Pt alert and oriented. Getting dressed, ambulatory with no issues. Calling for ride.

## 2016-12-18 NOTE — ED Notes (Signed)
Went over discharge paperwork with pt and given narcan to go home with. MD talked to patient about narcan prescription and pt verbalized understanding of instructions.

## 2016-12-18 NOTE — ED Triage Notes (Addendum)
Pt to ED under IVC after a heroin OD. Pt was found by housekeeping who then called police. Pt report he had been 18 days sober and reports he has increased anxiety and stress and recently has lost insurance and can no longer afford to see PCP and receive previously prescribed Klonopin. Pt reports he took three Klonopin's yesterday and then did heroin to "feel better." Pt denies other drug use and denies drinking. Pt denies visual and auditory hallucinations. Pt denies SI/HI. Pt report shaving a hx of hospitalization for anxiety and drug use.   Pt tearful and apologetic in triage.

## 2016-12-18 NOTE — ED Provider Notes (Signed)
Patient presented very briefly with EMS.  EMS reports are called out and the patient was noted to have a needle in his left arm and decreased responsiveness.  He awoke and did not require any naloxone with EMS.  They report that he yourself reports heroin abuse and now wishes to leave.  I did see him very briefly in the hallway, he is alert oriented in no distress and ambulatory.  He reports that he does not wish to stay, I offered him evaluation in the ER but he is refused.  He does not appear to be lacking in capacity though I suspect he does have a significant heroin abuse type problem based on the history given.  He reports that he refused to stay, and that we cannot "hold him against his will".  The patient got up, began pulling his IV out and essentially left but is agreeable to leaving [place].    He may be slightly under the influence of heroin, however he is awake alert oriented, unable to state back at the risk of leaving is potentially that he could die.  At this point, he denies any suicidal ideation or hallucinations.  Reports he is using recreationally and did not require naloxone for reversal.  Although there is certainly a spectrum, I do not believe this patient needs to be under involuntary commitment at this time though he certainly is at risk of death from ongoing heroin abuse.  The patient did not allow me to perform a full examination or review of systems, but was able to provide this history.  He is agreeable to leaving [place] and certainly understands that he is at risk for death or dying.  He plans to call someone who will pick him up.  He is ambulatory alert oriented and in no distress at this time.   Delman Kitten, MD 12/18/16 1740

## 2016-12-18 NOTE — ED Notes (Signed)
Patient refusing care and requesting to leave AMA. Seen by Dr. Jacqualine Code. Quale agreed to [facility] discharge. Patient ambulatory out the door with difficulty.

## 2016-12-18 NOTE — ED Notes (Signed)
Pt was "dressed out" while in triage. Items removed and bagged include one pair of shoes, one pair of socks, one pair of underwear, two shirts, one pair of pants, one cell phone, one wallet. Pt roomed to [facility].

## 2016-12-18 NOTE — Discharge Instructions (Signed)
You have been seen in the Emergency Department (ED) today for substance abuse.  You have been evaluated by the behavioral medicine specialists and are being [facility]  Please return to the ED immediately if you have ANY thoughts of hurting yourself or anyone else, so that we may help you.  Please avoid alcohol and drug use.  Follow up with your doctor and/or therapist as soon as possible regarding today's ED  visit.   Please follow up any other recommendations and clinic appointments provided by the psychiatry team that saw you in the Emergency Department.    No driving.  DO NOT USE ANY ILLEGAL DRUGS, this can kill you and is extremely dangerous.

## 2016-12-18 NOTE — ED Notes (Signed)
Pt satting 89% r/a. Placed on 2 L [place]. MD notified

## 2016-12-18 NOTE — ED Notes (Signed)
Pt keeps removing o2 from nose. 02 96%, hr 65, bp 101/73

## 2016-12-18 NOTE — ED Notes (Signed)
Pt refusing IV. Easy to arouse at this time, alert and oriented. Pt asking for food, only ice chips at this time per MD.

## 2017-02-01 ENCOUNTER — Other Ambulatory Visit: Payer: Self-pay

## 2017-02-01 ENCOUNTER — Emergency Department: Payer: Self-pay

## 2017-02-01 ENCOUNTER — Emergency Department
Admission: EM | Admit: 2017-02-01 | Discharge: 2017-02-01 | Disposition: A | Discharge status: Home / Self Care | Payer: Self-pay | Attending: Emergency Medicine | Admitting: Emergency Medicine

## 2017-02-01 DIAGNOSIS — Z87891 Personal history of nicotine dependence: Secondary | ICD-10-CM | POA: Insufficient documentation

## 2017-02-01 DIAGNOSIS — Z85820 Personal history of malignant melanoma of skin: Secondary | ICD-10-CM | POA: Insufficient documentation

## 2017-02-01 DIAGNOSIS — R079 Chest pain, unspecified: Secondary | ICD-10-CM | POA: Insufficient documentation

## 2017-02-01 LAB — BASIC METABOLIC PANEL
ANION GAP: 7 (ref 5–15)
BUN: 15 mg/dL (ref 6–20)
CALCIUM: 8.8 mg/dL — AB (ref 8.9–10.3)
CHLORIDE: 101 mmol/L (ref 101–111)
CO2: 24 mmol/L (ref 22–32)
CREATININE: 0.75 mg/dL (ref 0.61–1.24)
GFR calc non Af Amer: >60 mL/min (ref >60)
Glucose, Bld: 92 mg/dL (ref 65–99)
Potassium: 3.7 mmol/L (ref 3.5–5.1)
SODIUM: 132 mmol/L — AB (ref 135–145)

## 2017-02-01 LAB — CBC
HCT: 42.2 % (ref 40.0–52.0)
HEMOGLOBIN: 14.9 g/dL (ref 13.0–18.0)
MCH: 30.6 pg (ref 26.0–34.0)
MCHC: 35.3 g/dL (ref 32.0–36.0)
MCV: 86.7 fL (ref 80.0–100.0)
PLATELETS: 365 10*3/uL (ref 150–440)
RBC: 4.86 MIL/uL (ref 4.40–5.90)
RDW: 14 % (ref 11.5–14.5)
WBC: 8.7 10*3/uL (ref 3.8–10.6)

## 2017-02-01 LAB — TROPONIN I: Troponin I: <0.03 ng/mL (ref <0.03)

## 2017-02-01 MED ORDER — HYDROXYZINE HCL 25 MG PO TABS
50.0000 mg | ORAL_TABLET | Freq: Once | ORAL | Status: AC
  Administered 2017-02-01: 50 mg via ORAL
  Filled 2017-02-01: qty 2

## 2017-02-01 MED ORDER — ASPIRIN 81 MG PO CHEW
CHEWABLE_TABLET | ORAL | Status: AC
  Filled 2017-02-01: qty 4

## 2017-02-01 MED ORDER — ASPIRIN 81 MG PO CHEW
324.0000 mg | CHEWABLE_TABLET | Freq: Once | ORAL | Status: AC
  Administered 2017-02-01: 324 mg via ORAL

## 2017-02-01 NOTE — ED Notes (Signed)
Able to get self dressed. Wallet in belongings bag per his choice. To lobby via wheelchair.

## 2017-02-01 NOTE — ED Provider Notes (Signed)
Signout from Dr. Owens Shark in this 32 year old male with a history of crack cocaine use and chest pain.  Patient is awaiting second troponin.  Plan is to discharge if troponin is negative.  Physical Exam  BP 112/78   Pulse (!) 101   Temp 97.7 F (36.5 C) (Oral)   Resp 18   Ht 5\' 6"  (1.676 m)   Wt 77.1 kg (170 lb)   SpO2 95%   BMI 27.44 kg/m  ----------------------------------------- 7:15 AM on 02/01/2017 -----------------------------------------   Physical Exam Patient asleep at this time but easily awoken.  No planes of chest pain at this time. ED Course/Procedures     Procedures  MDM  Patient with 2- troponins.  No further complaints of pain.  No distress.  Patient will be discharged as planned.  Patient aware of his lab results and treatment plan willing to comply.      Orbie Pyo, MD 02/01/17 [phone]

## 2017-02-01 NOTE — ED Triage Notes (Signed)
Patient reports developed chest pain while using crack.  Skin warm and dry, color within normal limits.

## 2017-02-01 NOTE — ED Notes (Signed)
Patient attempting to find ride at this time.

## 2017-02-01 NOTE — ED Provider Notes (Signed)
[facility] Emergency Department Provider Note   First MD Initiated Contact with Patient 02/01/17 [phone]     (approximate)  I have reviewed the triage vital signs and the nursing notes.   HISTORY  Chief Complaint Chest Pain    HPI Zachary Madden is a 32 y.o. male name presents to the emergency department with acute onset of central chest pain with radiation to the left arm following smoking crack cocaine tonight.  Patient denies any dyspnea no diaphoresis but admits to feeling anxious.  Patient also states that he has ran out of his Klonopin which he normally takes for anxiety and that he feels very anxious at this time.  In addition patient states that he has not used crack cocaine since he was in college many years ago for using tonight.   Past Medical History:  Diagnosis Date  . Allergy   . Anxiety   . Cholelithiasis   . Mental disorder   . Sciatica     Patient Active Problem List   Diagnosis Date Noted  . Opiate abuse, episodic ([place]) 12/18/2016  . Overdose opiate, accidental or unintentional, sequela 09/03/2016  . Opiate withdrawal ([place]) 06/21/2015  . Melanoma in situ of back ([place]) 01/01/2015  . GAD (generalized anxiety disorder) 02/20/2013  . Panic attacks 02/20/2013  . Opiate dependence ([place]) 02/19/2013  . Benzodiazepine dependence, continuous ([place]) 02/18/2013  . 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 ALLERGIC RHINITIS 09/14/2008    No past surgical history on file.  Prior to Admission medications   Medication Sig Start Date End Date Taking? Authorizing Provider  escitalopram (LEXAPRO) 20 MG tablet Take 1 tablet (20 mg total) by mouth at bedtime. Patient not taking: Reported on 09/03/2016 01/01/15   Dorena Cookey, MD  Multiple Vitamin (MULTIVITAMIN WITH MINERALS) TABS tablet Take 1 tablet by mouth daily. For low vitamin Patient not taking: Reported on 09/03/2016 02/20/13   Lindell Spar I, NP    Allergies No known drug allergies  Family  History  Problem Relation Age of Onset  . Anxiety disorder Mother   . Chronic fatigue Mother   . Heart disease Father   . Diabetes Father   . Cancer Father        Cancer    Social History Social History   Tobacco Use  . Smoking status: Former Research scientist (life sciences)  . Smokeless tobacco: Current User    Types: Snuff  Substance Use Topics  . Alcohol use: No    Comment: Patient denies  . Drug use: Yes    Types: Benzodiazepines, Heroin    Comment: Patient reports he uses benzos and heroin    Review of Systems Constitutional: No fever/chills Eyes: No visual changes. ENT: No sore throat. Cardiovascular: Positive for chest pain. Respiratory: Denies shortness of breath. Gastrointestinal: No abdominal pain.  No nausea, no vomiting.  No diarrhea.  No constipation. Genitourinary: Negative for dysuria. Musculoskeletal: Negative for neck pain.  Negative for back pain. Integumentary: Negative for rash. Neurological: Negative for headaches, focal weakness or numbness.   ____________________________________________   PHYSICAL EXAM:  VITAL SIGNS: ED Triage Vitals  Enc Vitals Group     BP 02/01/17 0309 113/76     Pulse Rate 02/01/17 0309 (!) 105     Resp 02/01/17 0309 20     Temp 02/01/17 0309 97.7 F (36.5 C)     Temp Source 02/01/17 0309 Oral     SpO2 02/01/17 0309 98 %     Weight 02/01/17 0307 77.1 kg (  170 lb)     Height 02/01/17 0307 1.676 m (5\' 6" )     Head Circumference --      Peak Flow --      Pain Score 02/01/17 0306 10     Pain Loc --      Pain Edu? --      Excl. in [place]? --     Constitutional: Alert and oriented. Well appearing and in no acute distress. Eyes: Conjunctivae are normal.  Head: Atraumatic. Mouth/Throat: Mucous membranes are moist.  Oropharynx non-erythematous. Neck: No stridor. Cardiovascular: Normal rate, regular rhythm. Good peripheral circulation. Grossly normal heart sounds. Respiratory: Normal respiratory effort.  No retractions. Lungs  CTAB. Gastrointestinal: Soft and nontender. No distention.  Musculoskeletal: No lower extremity tenderness nor edema. No gross deformities of extremities. Neurologic:  Normal speech and language. No gross focal neurologic deficits are appreciated.  Skin:  Skin is warm, dry and intact. No rash noted. Psychiatric: Mood and affect are normal. Speech and behavior are normal.  ____________________________________________   LABS (all labs ordered are listed, but only abnormal results are displayed)  Labs Reviewed  BASIC METABOLIC PANEL - Abnormal; Notable for the following components:      Result Value   Sodium 132 (*)    Calcium 8.8 (*)    All other components within normal limits  CBC  TROPONIN I  TROPONIN I   ____________________________________________  EKG  ED ECG REPORT I, [place] N BROWN, the attending physician, personally viewed and interpreted this ECG.   Date: 02/01/2017  EKG Time: 3:05AM  Rate: 107  Rhythm: Sinus Tachycardia  Axis: Normal  Intervals:Normal  ST&T Change: None  ____________________________________________  RADIOLOGY I, Berkley Ernst Bowler, personally viewed and evaluated these images (plain radiographs) as part of my medical decision making, as well as reviewing the written report by the radiologist.  Dg Chest 2 View  Result Date: 02/01/2017 CLINICAL DATA:  Chest pain while using crack. EXAM: CHEST  2 VIEW COMPARISON:  Chest radiograph September 03, 2016 FINDINGS: Cardiomediastinal silhouette is normal. No pleural effusions or focal consolidations. Trachea projects midline and there is no pneumothorax. Soft tissue planes and included osseous structures are non-suspicious. IMPRESSION: Negative. Electronically Signed   By: Elon Alas M.D.   On: 02/01/2017 03:31     Procedures   ____________________________________________   INITIAL IMPRESSION / ASSESSMENT AND PLAN / ED COURSE  As part of my medical decision making, I reviewed the following  data within the electronic MEDICAL RECORD [id number] year old male presented with above-stated history following smoking crack cocaine.  EKG revealed no evidence of ST segment laboratory data notable for negative troponin x1 plan to repeat troponin and if negative patient safe for discharge.  Patient's care transferred to Dr. Clearnce Hasten ____________________________________________  FINAL CLINICAL IMPRESSION(S) / ED DIAGNOSES  Final diagnoses:  Chest pain, unspecified type     MEDICATIONS GIVEN DURING THIS VISIT:  Medications  aspirin chewable tablet 324 mg (324 mg Oral Given 02/01/17 0446)  hydrOXYzine (ATARAX/VISTARIL) tablet 50 mg (50 mg Oral Given 02/01/17 0533)     ED Discharge Orders    None       Note:  This document was prepared using Dragon voice recognition software and may include unintentional dictation errors.    Gregor Hams, MD 02/02/17 [phone]

## 2017-04-23 ENCOUNTER — Other Ambulatory Visit: Payer: Self-pay

## 2017-04-23 ENCOUNTER — Encounter: Payer: Self-pay | Admitting: Emergency Medicine

## 2017-04-23 ENCOUNTER — Emergency Department
Admission: EM | Admit: 2017-04-23 | Discharge: 2017-04-24 | Disposition: A | Discharge status: Home / Self Care | Payer: Self-pay | Attending: Emergency Medicine | Admitting: Emergency Medicine

## 2017-04-23 DIAGNOSIS — Z87891 Personal history of nicotine dependence: Secondary | ICD-10-CM | POA: Insufficient documentation

## 2017-04-23 DIAGNOSIS — R4689 Other symptoms and signs involving appearance and behavior: Secondary | ICD-10-CM

## 2017-04-23 DIAGNOSIS — F39 Unspecified mood [affective] disorder: Secondary | ICD-10-CM | POA: Insufficient documentation

## 2017-04-23 DIAGNOSIS — Z79899 Other long term (current) drug therapy: Secondary | ICD-10-CM | POA: Insufficient documentation

## 2017-04-23 DIAGNOSIS — F1110 Opioid abuse, uncomplicated: Secondary | ICD-10-CM | POA: Insufficient documentation

## 2017-04-23 LAB — COMPREHENSIVE METABOLIC PANEL
ALT: 14 U/L — ABNORMAL LOW (ref 17–63)
ANION GAP: 7 (ref 5–15)
AST: 17 U/L (ref 15–41)
Albumin: 4.2 g/dL (ref 3.5–5.0)
Alkaline Phosphatase: 80 U/L (ref 38–126)
BUN: 11 mg/dL (ref 6–20)
CHLORIDE: 103 mmol/L (ref 101–111)
CO2: 30 mmol/L (ref 22–32)
Calcium: 9.2 mg/dL (ref 8.9–10.3)
Creatinine, Ser: 0.81 mg/dL (ref 0.61–1.24)
Glucose, Bld: 102 mg/dL — ABNORMAL HIGH (ref 65–99)
POTASSIUM: 4.3 mmol/L (ref 3.5–5.1)
Sodium: 140 mmol/L (ref 135–145)
TOTAL PROTEIN: 7.8 g/dL (ref 6.5–8.1)
Total Bilirubin: 0.6 mg/dL (ref 0.3–1.2)

## 2017-04-23 LAB — URINE DRUG SCREEN, QUALITATIVE (ARMC ONLY)
Amphetamines, Ur Screen: NOT DETECTED
BENZODIAZEPINE, UR SCRN: POSITIVE — AB
Barbiturates, Ur Screen: NOT DETECTED
Cannabinoid 50 Ng, Ur ~~LOC~~: NOT DETECTED
Cocaine Metabolite,Ur ~~LOC~~: NOT DETECTED
MDMA (ECSTASY) UR SCREEN: NOT DETECTED
Methadone Scn, Ur: NOT DETECTED
Opiate, Ur Screen: POSITIVE — AB
PHENCYCLIDINE (PCP) UR S: NOT DETECTED
TRICYCLIC, UR SCREEN: NOT DETECTED

## 2017-04-23 LAB — CBC
HCT: 42.3 % (ref 40.0–52.0)
Hemoglobin: 14.3 g/dL (ref 13.0–18.0)
MCH: 29.6 pg (ref 26.0–34.0)
MCHC: 33.9 g/dL (ref 32.0–36.0)
MCV: 87.4 fL (ref 80.0–100.0)
PLATELETS: 325 10*3/uL (ref 150–440)
RBC: 4.84 MIL/uL (ref 4.40–5.90)
RDW: 13 % (ref 11.5–14.5)
WBC: 7.7 10*3/uL (ref 3.8–10.6)

## 2017-04-23 LAB — ACETAMINOPHEN LEVEL: Acetaminophen (Tylenol), Serum: <10 ug/mL — ABNORMAL LOW (ref 10–30)

## 2017-04-23 LAB — SALICYLATE LEVEL

## 2017-04-23 LAB — ETHANOL

## 2017-04-23 NOTE — ED Notes (Signed)

## 2017-04-23 NOTE — ED Notes (Signed)
BEHAVIORAL HEALTH ROUNDING  Patient sleeping: No.  Patient alert and oriented: yes  Behavior appropriate: Yes. ; If no, describe:  Nutrition and fluids offered: Yes  Toileting and hygiene offered: Yes  Sitter present: not applicable, Q 15 min safety rounds and observation.  Law enforcement present: Yes ODS  

## 2017-04-23 NOTE — ED Provider Notes (Signed)
[facility] Emergency Department Provider Note  ___________________________________________   First MD Initiated Contact with Patient 04/23/17 2103     (approximate)  I have reviewed the triage vital signs and the nursing notes.   HISTORY  Chief Complaint Psychiatric Evaluation   HPI Zachary Madden is a 32 y.o. male with a history of opiate abuse who is presenting to the emergency department today under involuntary commitment.  He says that he injected heroin in his right arm earlier today at his boarding home.  He says that the experience was overwhelming for him and he says that he became very agitated.  Another resident called police and he was involuntarily committed for what is reported on the IVC as "heroin overdose."  The patient denies any suicidal or homicidal ideation.  Says that he goes to both AA and NA.  Past Medical History:  Diagnosis Date  . Allergy   . Anxiety   . Cholelithiasis   . Mental disorder   . Sciatica     Patient Active Problem List   Diagnosis Date Noted  . Opiate abuse, episodic ([place]) 12/18/2016  . Overdose opiate, accidental or unintentional, sequela 09/03/2016  . Opiate withdrawal ([place]) 06/21/2015  . Melanoma in situ of back ([place]) 01/01/2015  . GAD (generalized anxiety disorder) 02/20/2013  . Panic attacks 02/20/2013  . Opiate dependence ([place]) 02/19/2013  . Benzodiazepine dependence, continuous ([place]) 02/18/2013  . 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 ALLERGIC RHINITIS 09/14/2008    History reviewed. No pertinent surgical history.  Prior to Admission medications   Medication Sig Start Date End Date Taking? Authorizing Provider  escitalopram (LEXAPRO) 20 MG tablet Take 1 tablet (20 mg total) by mouth at bedtime. Patient not taking: Reported on 09/03/2016 01/01/15   Dorena Cookey, MD  Multiple Vitamin (MULTIVITAMIN WITH MINERALS) TABS tablet Take 1 tablet by mouth daily. For low vitamin Patient not taking: Reported on  09/03/2016 02/20/13   Lindell Spar I, NP    Allergies Patient has no known allergies.  Family History  Problem Relation Age of Onset  . Anxiety disorder Mother   . Chronic fatigue Mother   . Heart disease Father   . Diabetes Father   . Cancer Father        Cancer    Social History Social History   Tobacco Use  . Smoking status: Former Research scientist (life sciences)  . Smokeless tobacco: Current User    Types: Snuff  Substance Use Topics  . Alcohol use: No    Comment: Patient denies  . Drug use: Yes    Types: Benzodiazepines, Heroin    Comment: Patient reports he uses benzos and heroin    Review of Systems  Constitutional: No fever/chills Eyes: No visual changes. ENT: No sore throat. Cardiovascular: Denies chest pain. Respiratory: Denies shortness of breath. Gastrointestinal: No abdominal pain.  No nausea, no vomiting.  No diarrhea.  No constipation. Genitourinary: Negative for dysuria. Musculoskeletal: Negative for back pain. Skin: Negative for rash. Neurological: Negative for headaches, focal weakness or numbness.   ____________________________________________   PHYSICAL EXAM:  VITAL SIGNS: ED Triage Vitals  Enc Vitals Group     BP 04/23/17 1907 106/70     Pulse Rate 04/23/17 1907 93     Resp 04/23/17 1907 18     Temp 04/23/17 1907 98.1 F (36.7 C)     Temp Source 04/23/17 1907 Oral     SpO2 04/23/17 1907 100 %     Weight 04/23/17 1908 180 lb (  81.6 kg)     Height 04/23/17 1908 5\' 6"  (1.676 m)     Head Circumference --      Peak Flow --      Pain Score 04/23/17 1908 0     Pain Loc --      Pain Edu? --      Excl. in [place]? --     Constitutional: Alert and oriented. Well appearing and in no acute distress. Eyes: Conjunctivae are normal.  Head: Atraumatic. Nose: No congestion/rhinnorhea. Mouth/Throat: Mucous membranes are moist.  Neck: No stridor.   Cardiovascular: Normal rate, regular rhythm. Grossly normal heart sounds.   Respiratory: Normal respiratory effort.  No  retractions. Lungs CTAB. Gastrointestinal: Soft and nontender. No distention. Musculoskeletal: No lower extremity tenderness nor edema.  No joint effusions. Neurologic:  Normal speech and language. No gross focal neurologic deficits are appreciated. Skin:  Skin is warm, dry and intact. No rash noted. Psychiatric: Mood and affect are normal. Speech and behavior are normal.  ____________________________________________   LABS (all labs ordered are listed, but only abnormal results are displayed)  Labs Reviewed  COMPREHENSIVE METABOLIC PANEL - Abnormal; Notable for the following components:      Result Value   Glucose, Bld 102 (*)    ALT 14 (*)    All other components within normal limits  ACETAMINOPHEN LEVEL - Abnormal; Notable for the following components:   Acetaminophen (Tylenol), Serum <10 (*)    All other components within normal limits  ETHANOL  SALICYLATE LEVEL  CBC  URINE DRUG SCREEN, QUALITATIVE ([facility] ONLY)  CBG MONITORING, ED   ____________________________________________  EKG ED ECG REPORT I, Doran Stabler, the attending physician, personally viewed and interpreted this ECG.   Date: 04/23/2017  EKG Time: 1913  Rate: 89  Rhythm: normal sinus rhythm  Axis: Normal  Intervals:none  ST&T Change: No ST segment elevation or depression.  No abnormal T wave inversion. ____________________________________________  RADIOLOGY   ____________________________________________   PROCEDURES  Procedure(s) performed:   Procedures  Critical Care performed:   ____________________________________________   INITIAL IMPRESSION / ASSESSMENT AND PLAN / ED COURSE  Pertinent labs & imaging results that were available during my care of the patient were reviewed by me and considered in my medical decision making (see chart for details).  DDX: Drug-induced psychosis, agitation, opiate overdose, opiate intoxication As part of my medical decision making, I reviewed the  following data within the [place] Notes from prior ED visits  I will uphold involuntary commitment and the patient will be evaluated by psychiatry.  Patient is understanding of this plan willing to comply. ____________________________________________   FINAL CLINICAL IMPRESSION(S) / ED DIAGNOSES  Opiate abuse.  Aggressive behavior.   NEW MEDICATIONS STARTED DURING THIS VISIT:  New Prescriptions   No medications on file     Note:  This document was prepared using Dragon voice recognition software and may include unintentional dictation errors.     Orbie Pyo, MD 04/23/17 2213

## 2017-04-23 NOTE — ED Triage Notes (Signed)
Here with [employer] IVC for heroin overdose,  Wants to be detox , denies HI or SI , [employer] IVC'd him due to his overdose, " he was not breathing when we showed up"

## 2017-04-23 NOTE — ED Triage Notes (Signed)
FIRST NURSE NOTE-IVC with [employer]. Placed in triage chairs with officer

## 2017-04-23 NOTE — ED Notes (Addendum)
Pt changed out to hospital scrubs , clothing placed in bag , tan pants, ball cap , brown wallet  Remains with belongings, red colored shoes.  Key ring to his place of stay

## 2017-04-24 NOTE — ED Provider Notes (Signed)
Patient evaluated by [facility] psychiatry Dr. Luz Lex with recommendation for discharge home.  Dr. Luz Lex reversed involuntary commitment   Gregor Hams, MD 04/24/17 [phone]

## 2017-04-24 NOTE — ED Notes (Signed)
BEHAVIORAL HEALTH ROUNDING  Patient sleeping: No.  Patient alert and oriented: yes  Behavior appropriate: Yes. ; If no, describe:  Nutrition and fluids offered: Yes  Toileting and hygiene offered: Yes  Sitter present: not applicable, Q 15 min safety rounds and observation.  Law enforcement present: Yes ODS  

## 2017-04-24 NOTE — ED Notes (Signed)
BEHAVIORAL HEALTH ROUNDING Patient sleeping: Yes.   Patient alert and oriented: not applicable SLEEPING Behavior appropriate: Yes.  ; If no, describe: SLEEPING Nutrition and fluids offered: No SLEEPING Toileting and hygiene offered: NoSLEEPING Sitter present: not applicable, Q 15 min safety rounds and observation. Law enforcement present: Yes ODS 

## 2017-04-24 NOTE — BH Assessment (Signed)
Assessment Note  Zachary Madden is an 32 y.o. male. IVC'd due to heroin overdose. Pt reports to taking heroin and woke up to [employer]. Pt reports fellow resident at boarding home in which he resides called [employer] after noticing pt unresponsive. Pt reports to having behavioral health hx of anxiety and opioid use. Pt denies SI/ HI or AH/ VH. Pt pleasant and calm at time of assessment. Pt reports that he is currently receiving OPT and med mgmt in [place], in addition to attending AA and NA meetings.   Diagnosis: Generalized anxiety disorder, opioid use disorder  Past Medical History:  Past Medical History:  Diagnosis Date  . Allergy   . Anxiety   . Cholelithiasis   . Mental disorder   . Sciatica     History reviewed. No pertinent surgical history.  Family History:  Family History  Problem Relation Age of Onset  . Anxiety disorder Mother   . Chronic fatigue Mother   . Heart disease Father   . Diabetes Father   . Cancer Father        Cancer    Social History:  reports that he has quit smoking. His smokeless tobacco use includes snuff. He reports that he has current or past drug history. Drugs: Benzodiazepines and Heroin. He reports that he does not drink alcohol.  Additional Social History:  Alcohol / Drug Use Pain Medications: see PTA Prescriptions: see PTA Over the Counter: see PTA History of alcohol / drug use?: Yes Longest period of sobriety (when/how long): unknown Negative Consequences of Use: Legal, Personal relationships, Work / Youth worker, Museum/gallery curator Substance #1 Name of Substance 1: opiates 1 - Age of First Use: 26 1 - Amount (size/oz): varies 1 - Frequency: weekly 1 - Duration: varies 1 - Last Use / Amount: yesterday Substance #2 Name of Substance 2: benzodiazepine 2 - Age of First Use: 30 2 - Amount (size/oz): varies 2 - Frequency: rare 2 - Duration: varies 2 - Last Use / Amount: yesterday  CIWA: CIWA-Ar BP: 103/76 Pulse Rate: 88 COWS:    Allergies: No Known  Allergies  Home Medications:  (Not in a hospital admission)  OB/GYN Status:  No LMP for male patient.  General Assessment Data Assessment unable to be completed: (Completed) Location of Assessment: [facility] ED TTS Assessment: In system Is this a Tele or Face-to-Face Assessment?: Face-to-Face Is this an Initial Assessment or a Re-assessment for this encounter?: Initial Assessment Marital status: Single(Pt reports he is engaged to [employer]) [place] name: N/A Is patient pregnant?: No Pregnancy Status: No Living Arrangements: Other (Comment)(Pt lives in boarding home) Can pt return to current living arrangement?: Yes Admission Status: Involuntary Is patient capable of signing voluntary admission?: No Referral Source: Other(Resident in boarding home) Insurance type: None  Medical Screening Exam ([place]) Medical Exam completed: Yes  Crisis Care Plan Living Arrangements: Other (Comment)(Pt lives in boarding home) Legal Guardian: (N/A) Name of Psychiatrist: [facility]- Dr. Diamantina Providence Name of Therapist: [facility]  Education Status Is patient currently in school?: No Is the patient employed, unemployed or receiving disability?: Unemployed  Risk to self with the past 6 months Suicidal Ideation: No Has patient been a risk to self within the past 6 months prior to admission? : Yes(Drug use) Suicidal Intent: No Has patient had any suicidal intent within the past 6 months prior to admission? : No Is patient at risk for suicide?: No, but patient needs Medical Clearance Suicidal Plan?: No Has patient had any suicidal plan within the past  6 months prior to admission? : No Access to Means: No What has been your use of drugs/alcohol within the last 12 months?: "opiates and benzos" Previous Attempts/Gestures: No How many times?: 0 Other Self Harm Risks: None indicated Triggers for Past Attempts: None known Intentional Self Injurious Behavior: None Family Suicide History: No Recent stressful  life event(s): Legal Issues, Conflict (Comment), Turmoil (Comment)(Pt has DUI charge, currently living in boarding home) Persecutory voices/beliefs?: No Depression: No(Pt reports none) Depression Symptoms: (N/A) Substance abuse history and/or treatment for substance abuse?: Yes Suicide prevention information given to non-admitted patients: Not applicable  Risk to Others within the past 6 months Homicidal Ideation: No Does patient have any lifetime risk of violence toward others beyond the six months prior to admission? : No Thoughts of Harm to Others: No Current Homicidal Intent: No Current Homicidal Plan: No Access to Homicidal Means: No Identified Victim: N/A History of harm to others?: No Assessment of Violence: None Noted Violent Behavior Description: N/A Does patient have access to weapons?: No Criminal Charges Pending?: Yes Describe Pending Criminal Charges: DUI Does patient have a court date: Yes Court Date: 06/09/17 Is patient on probation?: Unknown  Psychosis Hallucinations: None noted Delusions: None noted  Mental Status Report Appearance/Hygiene: Unremarkable, In scrubs Eye Contact: Good Motor Activity: Unremarkable Speech: Logical/coherent Level of Consciousness: Alert(Pt asleep prior to assessment despite loud noises in ED,) Mood: Preoccupied, Despair Affect: Appropriate to circumstance, Anxious Anxiety Level: Moderate Thought Processes: Relevant Judgement: Partial Orientation: Appropriate for developmental age Obsessive Compulsive Thoughts/Behaviors: None  Cognitive Functioning Concentration: Fair Memory: Remote Intact Is patient IDD: No Is patient DD?: No Insight: Fair Impulse Control: Good Appetite: Good Have you had any weight changes? : No Change Sleep: No Change Total Hours of Sleep: 9 Vegetative Symptoms: None  ADLScreening [facility] Assessment Services) Patient's cognitive ability adequate to safely complete daily activities?: Yes Patient able  to express need for assistance with ADLs?: Yes Independently performs ADLs?: Yes (appropriate for developmental age)  Prior Inpatient Therapy Prior Inpatient Therapy: No(ED visits throughout past 5 years linked to SA and Anxiety) Prior Therapy Facilty/Provider(s): unknown  Prior Outpatient Therapy Prior Outpatient Therapy: Yes Prior Therapy Dates: 2015-current Prior Therapy Facilty/Provider(s): [facility] Reason for Treatment: SA and MH Does patient have an ACCT team?: No Does patient have Intensive In-House Services?  : No Does patient have [facility] services? : No Does patient have P4CC services?: No  ADL Screening (condition at time of admission) Patient's cognitive ability adequate to safely complete daily activities?: Yes Is the patient deaf or have difficulty hearing?: No Does the patient have difficulty seeing, even when wearing glasses/contacts?: No Does the patient have difficulty concentrating, remembering, or making decisions?: No Patient able to express need for assistance with ADLs?: Yes Does the patient have difficulty dressing or bathing?: No Independently performs ADLs?: Yes (appropriate for developmental age) Does the patient have difficulty walking or climbing stairs?: No Weakness of Legs: None Weakness of Arms/Hands: None  Home Assistive Devices/Equipment Home Assistive Devices/Equipment: None  Therapy Consults (therapy consults require a physician order) PT Evaluation Needed: No OT Evalulation Needed: No SLP Evaluation Needed: No Abuse/Neglect Assessment (Assessment to be complete while patient is alone) Abuse/Neglect Assessment Can Be Completed: Yes Physical Abuse: Denies Verbal Abuse: Denies Sexual Abuse: Denies Exploitation of patient/patient's resources: Denies Self-Neglect: Denies Values / Beliefs Cultural Requests During Hospitalization: None Spiritual Requests During Hospitalization: None Consults Spiritual Care Consult Needed: No Social Work Consult  Needed: No      Additional Information 1:1  In Past 12 Months?: No CIRT Risk: No Elopement Risk: No Does patient have medical clearance?: Yes     Disposition:  Disposition Initial Assessment Completed for this Encounter: Yes Disposition of Patient: (Pending P) Patient refused recommended treatment: No Mode of transportation if patient is discharged?: Other(Transport) Patient referred to: (Pending Psych consult)  On Site Evaluation by:   Reviewed with Physician:    Ethelene Browns 04/24/2017 3:37 AM

## 2017-04-24 NOTE — ED Notes (Signed)
Report given to SOC MD.  
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
